# Patient Record
Sex: Female | Born: 1935
Health system: Southern US, Community
[De-identification: ages and names within clinical notes are randomized; demographics above are authoritative.]

## PROBLEM LIST (undated history)

## (undated) DIAGNOSIS — E785 Hyperlipidemia, unspecified: Secondary | ICD-10-CM

## (undated) DIAGNOSIS — G309 Alzheimer's disease, unspecified: Secondary | ICD-10-CM

## (undated) DIAGNOSIS — M4800 Spinal stenosis, site unspecified: Secondary | ICD-10-CM

## (undated) DIAGNOSIS — I10 Essential (primary) hypertension: Secondary | ICD-10-CM

## (undated) DIAGNOSIS — F0280 Dementia in other diseases classified elsewhere without behavioral disturbance: Secondary | ICD-10-CM

## (undated) DIAGNOSIS — F32A Depression, unspecified: Secondary | ICD-10-CM

## (undated) DIAGNOSIS — M810 Age-related osteoporosis without current pathological fracture: Secondary | ICD-10-CM

## (undated) DIAGNOSIS — F329 Major depressive disorder, single episode, unspecified: Secondary | ICD-10-CM

---

## 2015-08-16 DIAGNOSIS — L820 Inflamed seborrheic keratosis: Secondary | ICD-10-CM | POA: Diagnosis not present

## 2015-08-16 DIAGNOSIS — L570 Actinic keratosis: Secondary | ICD-10-CM | POA: Diagnosis not present

## 2015-09-01 DIAGNOSIS — L600 Ingrowing nail: Secondary | ICD-10-CM | POA: Diagnosis not present

## 2015-09-01 DIAGNOSIS — S90932S Unspecified superficial injury of left great toe, sequela: Secondary | ICD-10-CM | POA: Diagnosis not present

## 2015-09-27 DIAGNOSIS — K5730 Diverticulosis of large intestine without perforation or abscess without bleeding: Secondary | ICD-10-CM | POA: Diagnosis not present

## 2015-09-27 DIAGNOSIS — K640 First degree hemorrhoids: Secondary | ICD-10-CM | POA: Diagnosis not present

## 2015-09-27 DIAGNOSIS — Z86010 Personal history of colonic polyps: Secondary | ICD-10-CM | POA: Diagnosis not present

## 2015-09-27 DIAGNOSIS — D122 Benign neoplasm of ascending colon: Secondary | ICD-10-CM | POA: Diagnosis not present

## 2015-10-13 DIAGNOSIS — N3946 Mixed incontinence: Secondary | ICD-10-CM | POA: Diagnosis not present

## 2015-11-02 DIAGNOSIS — R34 Anuria and oliguria: Secondary | ICD-10-CM | POA: Diagnosis not present

## 2015-11-02 DIAGNOSIS — K5909 Other constipation: Secondary | ICD-10-CM | POA: Diagnosis not present

## 2015-11-02 DIAGNOSIS — K5900 Constipation, unspecified: Secondary | ICD-10-CM | POA: Diagnosis not present

## 2015-11-13 DIAGNOSIS — M1711 Unilateral primary osteoarthritis, right knee: Secondary | ICD-10-CM | POA: Diagnosis not present

## 2015-11-29 DIAGNOSIS — H16223 Keratoconjunctivitis sicca, not specified as Sjogren's, bilateral: Secondary | ICD-10-CM | POA: Diagnosis not present

## 2015-11-29 DIAGNOSIS — H1711 Central corneal opacity, right eye: Secondary | ICD-10-CM | POA: Diagnosis not present

## 2015-11-29 DIAGNOSIS — H01001 Unspecified blepharitis right upper eyelid: Secondary | ICD-10-CM | POA: Diagnosis not present

## 2015-12-06 DIAGNOSIS — N3942 Incontinence without sensory awareness: Secondary | ICD-10-CM | POA: Diagnosis not present

## 2015-12-06 DIAGNOSIS — N3946 Mixed incontinence: Secondary | ICD-10-CM | POA: Diagnosis not present

## 2015-12-06 DIAGNOSIS — N3944 Nocturnal enuresis: Secondary | ICD-10-CM | POA: Diagnosis not present

## 2015-12-06 DIAGNOSIS — Z0000 Encounter for general adult medical examination without abnormal findings: Secondary | ICD-10-CM | POA: Diagnosis not present

## 2015-12-22 DIAGNOSIS — Z0000 Encounter for general adult medical examination without abnormal findings: Secondary | ICD-10-CM | POA: Diagnosis not present

## 2015-12-22 DIAGNOSIS — N3020 Other chronic cystitis without hematuria: Secondary | ICD-10-CM | POA: Diagnosis not present

## 2015-12-22 DIAGNOSIS — N3946 Mixed incontinence: Secondary | ICD-10-CM | POA: Diagnosis not present

## 2016-01-12 DIAGNOSIS — N3941 Urge incontinence: Secondary | ICD-10-CM | POA: Diagnosis not present

## 2016-01-30 DIAGNOSIS — I10 Essential (primary) hypertension: Secondary | ICD-10-CM | POA: Diagnosis not present

## 2016-01-30 DIAGNOSIS — R011 Cardiac murmur, unspecified: Secondary | ICD-10-CM | POA: Diagnosis not present

## 2016-01-30 DIAGNOSIS — R002 Palpitations: Secondary | ICD-10-CM | POA: Diagnosis not present

## 2016-04-17 DIAGNOSIS — L570 Actinic keratosis: Secondary | ICD-10-CM | POA: Diagnosis not present

## 2016-04-17 DIAGNOSIS — Z85828 Personal history of other malignant neoplasm of skin: Secondary | ICD-10-CM | POA: Diagnosis not present

## 2016-04-17 DIAGNOSIS — Z08 Encounter for follow-up examination after completed treatment for malignant neoplasm: Secondary | ICD-10-CM | POA: Diagnosis not present

## 2016-05-27 DIAGNOSIS — H524 Presbyopia: Secondary | ICD-10-CM | POA: Diagnosis not present

## 2016-05-27 DIAGNOSIS — H5203 Hypermetropia, bilateral: Secondary | ICD-10-CM | POA: Diagnosis not present

## 2016-05-27 DIAGNOSIS — H52203 Unspecified astigmatism, bilateral: Secondary | ICD-10-CM | POA: Diagnosis not present

## 2016-05-27 DIAGNOSIS — H16223 Keratoconjunctivitis sicca, not specified as Sjogren's, bilateral: Secondary | ICD-10-CM | POA: Diagnosis not present

## 2016-07-08 DIAGNOSIS — L853 Xerosis cutis: Secondary | ICD-10-CM | POA: Diagnosis not present

## 2016-09-18 DIAGNOSIS — L570 Actinic keratosis: Secondary | ICD-10-CM | POA: Diagnosis not present

## 2016-09-18 DIAGNOSIS — L814 Other melanin hyperpigmentation: Secondary | ICD-10-CM | POA: Diagnosis not present

## 2016-10-16 DIAGNOSIS — F5104 Psychophysiologic insomnia: Secondary | ICD-10-CM | POA: Diagnosis not present

## 2016-10-16 DIAGNOSIS — R011 Cardiac murmur, unspecified: Secondary | ICD-10-CM | POA: Diagnosis not present

## 2016-10-16 DIAGNOSIS — R002 Palpitations: Secondary | ICD-10-CM | POA: Diagnosis not present

## 2016-10-16 DIAGNOSIS — R42 Dizziness and giddiness: Secondary | ICD-10-CM | POA: Diagnosis not present

## 2016-10-16 DIAGNOSIS — I10 Essential (primary) hypertension: Secondary | ICD-10-CM | POA: Diagnosis not present

## 2016-10-25 DIAGNOSIS — R002 Palpitations: Secondary | ICD-10-CM | POA: Diagnosis not present

## 2016-11-07 DIAGNOSIS — R002 Palpitations: Secondary | ICD-10-CM | POA: Diagnosis not present

## 2016-12-02 DIAGNOSIS — R42 Dizziness and giddiness: Secondary | ICD-10-CM | POA: Diagnosis not present

## 2016-12-02 DIAGNOSIS — R413 Other amnesia: Secondary | ICD-10-CM | POA: Diagnosis not present

## 2016-12-02 DIAGNOSIS — D649 Anemia, unspecified: Secondary | ICD-10-CM | POA: Diagnosis not present

## 2016-12-02 DIAGNOSIS — I10 Essential (primary) hypertension: Secondary | ICD-10-CM | POA: Diagnosis not present

## 2016-12-17 DIAGNOSIS — R413 Other amnesia: Secondary | ICD-10-CM | POA: Diagnosis not present

## 2016-12-30 DIAGNOSIS — H26492 Other secondary cataract, left eye: Secondary | ICD-10-CM | POA: Diagnosis not present

## 2016-12-30 DIAGNOSIS — H1711 Central corneal opacity, right eye: Secondary | ICD-10-CM | POA: Diagnosis not present

## 2016-12-30 DIAGNOSIS — H5203 Hypermetropia, bilateral: Secondary | ICD-10-CM | POA: Diagnosis not present

## 2016-12-30 DIAGNOSIS — H01005 Unspecified blepharitis left lower eyelid: Secondary | ICD-10-CM | POA: Diagnosis not present

## 2016-12-30 DIAGNOSIS — H43393 Other vitreous opacities, bilateral: Secondary | ICD-10-CM | POA: Diagnosis not present

## 2016-12-30 DIAGNOSIS — H01004 Unspecified blepharitis left upper eyelid: Secondary | ICD-10-CM | POA: Diagnosis not present

## 2016-12-30 DIAGNOSIS — H16223 Keratoconjunctivitis sicca, not specified as Sjogren's, bilateral: Secondary | ICD-10-CM | POA: Diagnosis not present

## 2016-12-30 DIAGNOSIS — H524 Presbyopia: Secondary | ICD-10-CM | POA: Diagnosis not present

## 2016-12-30 DIAGNOSIS — H01001 Unspecified blepharitis right upper eyelid: Secondary | ICD-10-CM | POA: Diagnosis not present

## 2016-12-30 DIAGNOSIS — H01002 Unspecified blepharitis right lower eyelid: Secondary | ICD-10-CM | POA: Diagnosis not present

## 2016-12-30 DIAGNOSIS — D3131 Benign neoplasm of right choroid: Secondary | ICD-10-CM | POA: Diagnosis not present

## 2016-12-30 DIAGNOSIS — H52203 Unspecified astigmatism, bilateral: Secondary | ICD-10-CM | POA: Diagnosis not present

## 2017-01-14 DIAGNOSIS — F341 Dysthymic disorder: Secondary | ICD-10-CM | POA: Diagnosis not present

## 2017-01-14 DIAGNOSIS — F0390 Unspecified dementia without behavioral disturbance: Secondary | ICD-10-CM | POA: Diagnosis not present

## 2017-01-17 DIAGNOSIS — M25552 Pain in left hip: Secondary | ICD-10-CM | POA: Diagnosis not present

## 2017-01-17 DIAGNOSIS — M5442 Lumbago with sciatica, left side: Secondary | ICD-10-CM | POA: Diagnosis not present

## 2017-02-05 DIAGNOSIS — F3289 Other specified depressive episodes: Secondary | ICD-10-CM | POA: Diagnosis not present

## 2017-02-10 DIAGNOSIS — G301 Alzheimer's disease with late onset: Secondary | ICD-10-CM | POA: Diagnosis not present

## 2017-02-10 DIAGNOSIS — F322 Major depressive disorder, single episode, severe without psychotic features: Secondary | ICD-10-CM | POA: Diagnosis not present

## 2017-02-10 DIAGNOSIS — R413 Other amnesia: Secondary | ICD-10-CM | POA: Diagnosis not present

## 2017-02-10 DIAGNOSIS — F0280 Dementia in other diseases classified elsewhere without behavioral disturbance: Secondary | ICD-10-CM | POA: Diagnosis not present

## 2017-02-10 DIAGNOSIS — F5104 Psychophysiologic insomnia: Secondary | ICD-10-CM | POA: Diagnosis not present

## 2017-02-13 DIAGNOSIS — M545 Low back pain: Secondary | ICD-10-CM | POA: Diagnosis not present

## 2017-02-13 DIAGNOSIS — M5416 Radiculopathy, lumbar region: Secondary | ICD-10-CM | POA: Diagnosis not present

## 2017-02-13 DIAGNOSIS — M5136 Other intervertebral disc degeneration, lumbar region: Secondary | ICD-10-CM | POA: Diagnosis not present

## 2017-02-17 DIAGNOSIS — T438X5A Adverse effect of other psychotropic drugs, initial encounter: Secondary | ICD-10-CM | POA: Diagnosis not present

## 2017-02-17 DIAGNOSIS — R21 Rash and other nonspecific skin eruption: Secondary | ICD-10-CM | POA: Diagnosis not present

## 2017-02-17 DIAGNOSIS — T380X5A Adverse effect of glucocorticoids and synthetic analogues, initial encounter: Secondary | ICD-10-CM | POA: Diagnosis not present

## 2017-02-19 DIAGNOSIS — M5136 Other intervertebral disc degeneration, lumbar region: Secondary | ICD-10-CM | POA: Diagnosis not present

## 2017-02-19 DIAGNOSIS — M5416 Radiculopathy, lumbar region: Secondary | ICD-10-CM | POA: Diagnosis not present

## 2017-02-20 DIAGNOSIS — J309 Allergic rhinitis, unspecified: Secondary | ICD-10-CM | POA: Diagnosis not present

## 2017-03-10 DIAGNOSIS — S3210XA Unspecified fracture of sacrum, initial encounter for closed fracture: Secondary | ICD-10-CM | POA: Diagnosis not present

## 2017-03-19 DIAGNOSIS — R413 Other amnesia: Secondary | ICD-10-CM | POA: Diagnosis not present

## 2017-03-19 DIAGNOSIS — M84350A Stress fracture, pelvis, initial encounter for fracture: Secondary | ICD-10-CM | POA: Diagnosis not present

## 2017-03-19 DIAGNOSIS — M48061 Spinal stenosis, lumbar region without neurogenic claudication: Secondary | ICD-10-CM | POA: Diagnosis not present

## 2017-03-19 DIAGNOSIS — M8000XD Age-related osteoporosis with current pathological fracture, unspecified site, subsequent encounter for fracture with routine healing: Secondary | ICD-10-CM | POA: Diagnosis not present

## 2017-04-02 DIAGNOSIS — S3219XA Other fracture of sacrum, initial encounter for closed fracture: Secondary | ICD-10-CM | POA: Diagnosis not present

## 2017-04-17 DIAGNOSIS — M545 Low back pain: Secondary | ICD-10-CM | POA: Diagnosis not present

## 2017-04-17 DIAGNOSIS — F322 Major depressive disorder, single episode, severe without psychotic features: Secondary | ICD-10-CM | POA: Diagnosis not present

## 2017-04-17 DIAGNOSIS — Z23 Encounter for immunization: Secondary | ICD-10-CM | POA: Diagnosis not present

## 2017-04-17 DIAGNOSIS — G301 Alzheimer's disease with late onset: Secondary | ICD-10-CM | POA: Diagnosis not present

## 2017-04-17 DIAGNOSIS — I10 Essential (primary) hypertension: Secondary | ICD-10-CM | POA: Diagnosis not present

## 2017-04-29 DIAGNOSIS — M4698 Unspecified inflammatory spondylopathy, sacral and sacrococcygeal region: Secondary | ICD-10-CM | POA: Diagnosis not present

## 2017-04-29 DIAGNOSIS — M84350A Stress fracture, pelvis, initial encounter for fracture: Secondary | ICD-10-CM | POA: Diagnosis not present

## 2017-04-29 DIAGNOSIS — M5136 Other intervertebral disc degeneration, lumbar region: Secondary | ICD-10-CM | POA: Diagnosis not present

## 2017-05-16 DIAGNOSIS — M48061 Spinal stenosis, lumbar region without neurogenic claudication: Secondary | ICD-10-CM | POA: Diagnosis not present

## 2017-05-16 DIAGNOSIS — M5136 Other intervertebral disc degeneration, lumbar region: Secondary | ICD-10-CM | POA: Diagnosis not present

## 2017-05-16 DIAGNOSIS — M84350A Stress fracture, pelvis, initial encounter for fracture: Secondary | ICD-10-CM | POA: Diagnosis not present

## 2017-05-16 DIAGNOSIS — M4698 Unspecified inflammatory spondylopathy, sacral and sacrococcygeal region: Secondary | ICD-10-CM | POA: Diagnosis not present

## 2017-05-21 DIAGNOSIS — M84350A Stress fracture, pelvis, initial encounter for fracture: Secondary | ICD-10-CM | POA: Diagnosis not present

## 2017-05-21 DIAGNOSIS — Z111 Encounter for screening for respiratory tuberculosis: Secondary | ICD-10-CM | POA: Diagnosis not present

## 2017-05-21 DIAGNOSIS — M4698 Unspecified inflammatory spondylopathy, sacral and sacrococcygeal region: Secondary | ICD-10-CM | POA: Diagnosis not present

## 2017-05-21 DIAGNOSIS — M48061 Spinal stenosis, lumbar region without neurogenic claudication: Secondary | ICD-10-CM | POA: Diagnosis not present

## 2017-05-21 DIAGNOSIS — I10 Essential (primary) hypertension: Secondary | ICD-10-CM | POA: Diagnosis not present

## 2017-05-21 DIAGNOSIS — D649 Anemia, unspecified: Secondary | ICD-10-CM | POA: Diagnosis not present

## 2017-05-21 DIAGNOSIS — M5136 Other intervertebral disc degeneration, lumbar region: Secondary | ICD-10-CM | POA: Diagnosis not present

## 2017-06-13 DIAGNOSIS — M4698 Unspecified inflammatory spondylopathy, sacral and sacrococcygeal region: Secondary | ICD-10-CM | POA: Diagnosis not present

## 2017-06-13 DIAGNOSIS — M5136 Other intervertebral disc degeneration, lumbar region: Secondary | ICD-10-CM | POA: Diagnosis not present

## 2017-06-13 DIAGNOSIS — M48061 Spinal stenosis, lumbar region without neurogenic claudication: Secondary | ICD-10-CM | POA: Diagnosis not present

## 2017-06-13 DIAGNOSIS — M545 Low back pain: Secondary | ICD-10-CM | POA: Diagnosis not present

## 2017-06-24 DIAGNOSIS — M4726 Other spondylosis with radiculopathy, lumbar region: Secondary | ICD-10-CM | POA: Diagnosis not present

## 2017-06-24 DIAGNOSIS — M5136 Other intervertebral disc degeneration, lumbar region: Secondary | ICD-10-CM | POA: Diagnosis not present

## 2017-06-24 DIAGNOSIS — M461 Sacroiliitis, not elsewhere classified: Secondary | ICD-10-CM | POA: Diagnosis not present

## 2017-06-24 DIAGNOSIS — M48061 Spinal stenosis, lumbar region without neurogenic claudication: Secondary | ICD-10-CM | POA: Diagnosis not present

## 2017-07-25 DIAGNOSIS — M48061 Spinal stenosis, lumbar region without neurogenic claudication: Secondary | ICD-10-CM | POA: Diagnosis not present

## 2017-07-25 DIAGNOSIS — M5136 Other intervertebral disc degeneration, lumbar region: Secondary | ICD-10-CM | POA: Diagnosis not present

## 2017-07-25 DIAGNOSIS — M25551 Pain in right hip: Secondary | ICD-10-CM | POA: Diagnosis not present

## 2017-09-02 DIAGNOSIS — I10 Essential (primary) hypertension: Secondary | ICD-10-CM | POA: Diagnosis not present

## 2017-09-02 DIAGNOSIS — M8000XD Age-related osteoporosis with current pathological fracture, unspecified site, subsequent encounter for fracture with routine healing: Secondary | ICD-10-CM | POA: Diagnosis not present

## 2017-09-02 DIAGNOSIS — R634 Abnormal weight loss: Secondary | ICD-10-CM | POA: Diagnosis not present

## 2017-09-02 DIAGNOSIS — G301 Alzheimer's disease with late onset: Secondary | ICD-10-CM | POA: Diagnosis not present

## 2017-09-02 DIAGNOSIS — M461 Sacroiliitis, not elsewhere classified: Secondary | ICD-10-CM | POA: Diagnosis not present

## 2017-09-02 DIAGNOSIS — M84350A Stress fracture, pelvis, initial encounter for fracture: Secondary | ICD-10-CM | POA: Diagnosis not present

## 2017-09-02 DIAGNOSIS — Z79899 Other long term (current) drug therapy: Secondary | ICD-10-CM | POA: Diagnosis not present

## 2017-09-02 DIAGNOSIS — M4698 Unspecified inflammatory spondylopathy, sacral and sacrococcygeal region: Secondary | ICD-10-CM | POA: Diagnosis not present

## 2017-09-03 DIAGNOSIS — R634 Abnormal weight loss: Secondary | ICD-10-CM | POA: Diagnosis not present

## 2017-09-10 DIAGNOSIS — R944 Abnormal results of kidney function studies: Secondary | ICD-10-CM | POA: Diagnosis not present

## 2017-09-10 DIAGNOSIS — R634 Abnormal weight loss: Secondary | ICD-10-CM | POA: Diagnosis not present

## 2017-09-17 DIAGNOSIS — M5126 Other intervertebral disc displacement, lumbar region: Secondary | ICD-10-CM | POA: Diagnosis not present

## 2017-09-17 DIAGNOSIS — M48061 Spinal stenosis, lumbar region without neurogenic claudication: Secondary | ICD-10-CM | POA: Diagnosis not present

## 2017-09-17 DIAGNOSIS — M810 Age-related osteoporosis without current pathological fracture: Secondary | ICD-10-CM | POA: Diagnosis not present

## 2017-09-17 DIAGNOSIS — F329 Major depressive disorder, single episode, unspecified: Secondary | ICD-10-CM | POA: Diagnosis not present

## 2017-09-17 DIAGNOSIS — Z9181 History of falling: Secondary | ICD-10-CM | POA: Diagnosis not present

## 2017-09-17 DIAGNOSIS — G301 Alzheimer's disease with late onset: Secondary | ICD-10-CM | POA: Diagnosis not present

## 2017-09-17 DIAGNOSIS — F0280 Dementia in other diseases classified elsewhere without behavioral disturbance: Secondary | ICD-10-CM | POA: Diagnosis not present

## 2017-09-17 DIAGNOSIS — I10 Essential (primary) hypertension: Secondary | ICD-10-CM | POA: Diagnosis not present

## 2017-10-02 DIAGNOSIS — M5136 Other intervertebral disc degeneration, lumbar region: Secondary | ICD-10-CM | POA: Diagnosis not present

## 2017-10-02 DIAGNOSIS — M461 Sacroiliitis, not elsewhere classified: Secondary | ICD-10-CM | POA: Diagnosis not present

## 2017-10-02 DIAGNOSIS — M48061 Spinal stenosis, lumbar region without neurogenic claudication: Secondary | ICD-10-CM | POA: Diagnosis not present

## 2017-10-02 DIAGNOSIS — M4698 Unspecified inflammatory spondylopathy, sacral and sacrococcygeal region: Secondary | ICD-10-CM | POA: Diagnosis not present

## 2017-10-13 DIAGNOSIS — F0280 Dementia in other diseases classified elsewhere without behavioral disturbance: Secondary | ICD-10-CM | POA: Diagnosis not present

## 2017-10-13 DIAGNOSIS — Z9181 History of falling: Secondary | ICD-10-CM | POA: Diagnosis not present

## 2017-10-13 DIAGNOSIS — G301 Alzheimer's disease with late onset: Secondary | ICD-10-CM | POA: Diagnosis not present

## 2017-10-13 DIAGNOSIS — M48061 Spinal stenosis, lumbar region without neurogenic claudication: Secondary | ICD-10-CM | POA: Diagnosis not present

## 2017-10-13 DIAGNOSIS — M5126 Other intervertebral disc displacement, lumbar region: Secondary | ICD-10-CM | POA: Diagnosis not present

## 2017-10-13 DIAGNOSIS — M810 Age-related osteoporosis without current pathological fracture: Secondary | ICD-10-CM | POA: Diagnosis not present

## 2017-10-13 DIAGNOSIS — F329 Major depressive disorder, single episode, unspecified: Secondary | ICD-10-CM | POA: Diagnosis not present

## 2017-10-13 DIAGNOSIS — I10 Essential (primary) hypertension: Secondary | ICD-10-CM | POA: Diagnosis not present

## 2017-11-01 ENCOUNTER — Other Ambulatory Visit: Payer: Self-pay

## 2017-11-01 ENCOUNTER — Emergency Department (HOSPITAL_BASED_OUTPATIENT_CLINIC_OR_DEPARTMENT_OTHER): Payer: PPO

## 2017-11-01 ENCOUNTER — Emergency Department (HOSPITAL_BASED_OUTPATIENT_CLINIC_OR_DEPARTMENT_OTHER)
Admission: EM | Admit: 2017-11-01 | Discharge: 2017-11-01 | Disposition: A | Discharge status: Home / Self Care | Payer: PPO | Attending: Emergency Medicine | Admitting: Emergency Medicine

## 2017-11-01 ENCOUNTER — Encounter (HOSPITAL_BASED_OUTPATIENT_CLINIC_OR_DEPARTMENT_OTHER): Payer: Self-pay | Admitting: Emergency Medicine

## 2017-11-01 DIAGNOSIS — R404 Transient alteration of awareness: Secondary | ICD-10-CM | POA: Diagnosis not present

## 2017-11-01 DIAGNOSIS — R42 Dizziness and giddiness: Secondary | ICD-10-CM | POA: Diagnosis not present

## 2017-11-01 DIAGNOSIS — G309 Alzheimer's disease, unspecified: Secondary | ICD-10-CM | POA: Diagnosis not present

## 2017-11-01 DIAGNOSIS — N3000 Acute cystitis without hematuria: Secondary | ICD-10-CM | POA: Diagnosis not present

## 2017-11-01 DIAGNOSIS — R011 Cardiac murmur, unspecified: Secondary | ICD-10-CM | POA: Diagnosis not present

## 2017-11-01 DIAGNOSIS — Z79899 Other long term (current) drug therapy: Secondary | ICD-10-CM | POA: Diagnosis not present

## 2017-11-01 DIAGNOSIS — I10 Essential (primary) hypertension: Secondary | ICD-10-CM | POA: Diagnosis not present

## 2017-11-01 HISTORY — DX: Essential (primary) hypertension: I10

## 2017-11-01 HISTORY — DX: Major depressive disorder, single episode, unspecified: F32.9

## 2017-11-01 HISTORY — DX: Alzheimer's disease, unspecified: G30.9

## 2017-11-01 HISTORY — DX: Depression, unspecified: F32.A

## 2017-11-01 HISTORY — DX: Dementia in other diseases classified elsewhere, unspecified severity, without behavioral disturbance, psychotic disturbance, mood disturbance, and anxiety: F02.80

## 2017-11-01 HISTORY — DX: Hyperlipidemia, unspecified: E78.5

## 2017-11-01 HISTORY — DX: Spinal stenosis, site unspecified: M48.00

## 2017-11-01 HISTORY — DX: Age-related osteoporosis without current pathological fracture: M81.0

## 2017-11-01 LAB — BASIC METABOLIC PANEL
ANION GAP: 9 (ref 5–15)
BUN: 16 mg/dL (ref 6–20)
CHLORIDE: 102 mmol/L (ref 101–111)
CO2: 25 mmol/L (ref 22–32)
Calcium: 9.1 mg/dL (ref 8.9–10.3)
Creatinine, Ser: 0.69 mg/dL (ref 0.44–1.00)
Glucose, Bld: 102 mg/dL — ABNORMAL HIGH (ref 65–99)
Potassium: 3.7 mmol/L (ref 3.5–5.1)
SODIUM: 136 mmol/L (ref 135–145)

## 2017-11-01 LAB — CBC WITH DIFFERENTIAL/PLATELET
BASOS ABS: 0 10*3/uL (ref 0.0–0.1)
Basophils Relative: 0 %
EOS PCT: 1 %
Eosinophils Absolute: 0.1 10*3/uL (ref 0.0–0.7)
HCT: 35.4 % — ABNORMAL LOW (ref 36.0–46.0)
HEMOGLOBIN: 11.3 g/dL — AB (ref 12.0–15.0)
LYMPHS PCT: 24 %
Lymphs Abs: 1.6 10*3/uL (ref 0.7–4.0)
MCH: 31.6 pg (ref 26.0–34.0)
MCHC: 31.9 g/dL (ref 30.0–36.0)
MCV: 98.9 fL (ref 78.0–100.0)
Monocytes Absolute: 0.6 10*3/uL (ref 0.1–1.0)
Monocytes Relative: 9 %
NEUTROS ABS: 4.4 10*3/uL (ref 1.7–7.7)
NEUTROS PCT: 66 %
PLATELETS: 183 10*3/uL (ref 150–400)
RBC: 3.58 MIL/uL — AB (ref 3.87–5.11)
RDW: 14.1 % (ref 11.5–15.5)
WBC: 6.7 10*3/uL (ref 4.0–10.5)

## 2017-11-01 LAB — URINALYSIS, ROUTINE W REFLEX MICROSCOPIC
BILIRUBIN URINE: NEGATIVE
GLUCOSE, UA: NEGATIVE mg/dL
Hgb urine dipstick: NEGATIVE
Ketones, ur: NEGATIVE mg/dL
Nitrite: POSITIVE — AB
PH: 6.5 (ref 5.0–8.0)
Protein, ur: NEGATIVE mg/dL
SPECIFIC GRAVITY, URINE: 1.01 (ref 1.005–1.030)

## 2017-11-01 LAB — URINALYSIS, MICROSCOPIC (REFLEX): RBC / HPF: NONE SEEN RBC/hpf (ref 0–5)

## 2017-11-01 LAB — PROTIME-INR
INR: 0.94
Prothrombin Time: 12.5 seconds (ref 11.4–15.2)

## 2017-11-01 MED ORDER — CEPHALEXIN 500 MG PO CAPS: 1000.0000 mg | ORAL_CAPSULE | Freq: Two times a day (BID) | ORAL | 0 refills | Status: AC

## 2017-11-01 MED ORDER — MECLIZINE HCL 25 MG PO TABS
25.0000 mg | ORAL_TABLET | Freq: Once | ORAL | Status: AC
Start: 2017-11-01 — End: 2017-11-01
  Administered 2017-11-01: 25 mg via ORAL
  Filled 2017-11-01: qty 1

## 2017-11-01 MED ORDER — MECLIZINE HCL 25 MG PO TABS: 25.0000 mg | ORAL_TABLET | Freq: Three times a day (TID) | ORAL | 0 refills | Status: AC | PRN

## 2017-11-01 NOTE — ED Provider Notes (Signed)
[facility] EMERGENCY DEPARTMENT Provider Note   CSN: [id number] Arrival date & time: 11/01/17  3295     History   Chief Complaint Chief Complaint  Patient presents with  . Dizziness    HPI Carolyn Schwartz is a 84 y.o. female.  HPI Yesterday afternoon patient reports he started to get dizzy episodes.  She started to feel unsteady and like she might fall.  Things were kind of moving around her.  No focal weakness numbness or tingling.  No headache.  Patient's daughter visited this morning and patient was having symptoms.  Patient's daughter reports that he had a seated position she was complaining of being dizzy.  There was no noted cognitive dysfunction or weakness or appearance of general illness. Past Medical History:  Diagnosis Date  . Alzheimer's dementia   . Depression   . Hyperlipidemia   . Hypertension   . Osteoporosis   . Spinal stenosis     There are no active problems to display for this patient.   The histories are not reviewed yet. Please review them in the "History" navigator section and refresh this [place].   OB History   None      Home Medications    Prior to Admission medications   Medication Sig Start Date End Date Taking? Authorizing Provider  calcium carbonate (TUMS - DOSED IN MG ELEMENTAL CALCIUM) 500 MG chewable tablet Chew 1 tablet by mouth daily.   Yes [provider]  donepezil (ARICEPT) 10 MG tablet Take 10 mg by mouth at bedtime.   Yes [provider]  famotidine (PEPCID) 20 MG tablet Take 20 mg by mouth 2 (two) times daily.   Yes [provider]  gabapentin (NEURONTIN) 100 MG capsule Take 100 mg by mouth 3 (three) times daily.   Yes [provider]  senna (SENOKOT) 8.6 MG tablet Take 1 tablet by mouth daily.   Yes [provider]  sertraline (ZOLOFT) 100 MG tablet Take 100 mg by mouth daily.   Yes [provider]  traMADol (ULTRAM) 50 MG tablet Take by mouth every 6 (six)  hours as needed.   Yes [provider]  cephALEXin (KEFLEX) 500 MG capsule Take 2 capsules (1,000 mg total) by mouth 2 (two) times daily. 11/01/17   Charlesetta Shanks, MD  meclizine (ANTIVERT) 25 MG tablet Take 1 tablet (25 mg total) by mouth 3 (three) times daily as needed for dizziness. 11/01/17   Charlesetta Shanks, MD    Family History No family history on file.  Social History Social History   Tobacco Use  . Smoking status: Not on file  Substance Use Topics  . Alcohol use: Not on file  . Drug use: Not on file     Allergies   Patient has no known allergies.   Review of Systems Review of Systems 10 Systems reviewed and are negative for acute change except as noted in the HPI.   Physical Exam Updated Vital Signs BP (!) 152/87 (BP Location: Right Arm)   Pulse 91   Temp 98.8 F (37.1 C)   Resp 20   Ht 5\' 4"  (1.626 m)   Wt 44.5 kg (98 lb)   SpO2 98%   BMI 16.82 kg/m   Physical Exam  Constitutional: She is oriented to person, place, and time. She appears well-developed and well-nourished. No distress.  HENT:  Head: Normocephalic and atraumatic.  Nose: Nose normal.  Mouth/Throat: Oropharynx is clear and moist.  Eyes: Pupils are equal, round, and  reactive to light. Conjunctivae and EOM are normal.  Neck: Neck supple.  Cardiovascular: Normal rate and regular rhythm.  Murmur heard. Pulmonary/Chest: Effort normal and breath sounds normal. No respiratory distress.  Abdominal: Soft. There is no tenderness.  Musculoskeletal: Normal range of motion. She exhibits no edema or tenderness.  Neurological: She is alert and oriented to person, place, and time. No cranial nerve deficit or sensory deficit. She exhibits normal muscle tone. Coordination normal.  Positive Dix-Hallpike maneuver to the left.  Skin: Skin is warm and dry.  Psychiatric: She has a normal mood and affect.  Nursing note and vitals reviewed.    ED Treatments / Results  Labs (all labs ordered are  listed, but only abnormal results are displayed) Labs Reviewed  URINALYSIS, ROUTINE W REFLEX MICROSCOPIC - Abnormal; Notable for the following components:      Result Value   Color, Urine STRAW (*)    Nitrite POSITIVE (*)    Leukocytes, UA TRACE (*)    All other components within normal limits  URINALYSIS, MICROSCOPIC (REFLEX) - Abnormal; Notable for the following components:   Bacteria, UA MANY (*)    Squamous Epithelial / LPF 0-5 (*)    All other components within normal limits  BASIC METABOLIC PANEL - Abnormal; Notable for the following components:   Glucose, Bld 102 (*)    All other components within normal limits  CBC WITH DIFFERENTIAL/PLATELET - Abnormal; Notable for the following components:   RBC 3.58 (*)    Hemoglobin 11.3 (*)    HCT 35.4 (*)    All other components within normal limits  URINE CULTURE  PROTIME-INR    EKG EKG Interpretation  Date/Time:  Saturday November 01 2017 11:17:12 EDT Ventricular Rate:  77 PR Interval:    QRS Duration: 97 QT Interval:  393 QTC Calculation: 445 R Axis:   -43 Text Interpretation:  Sinus rhythm LVH with secondary repolarization abnormality Anterior Q waves, possibly due to LVH Baseline wander in lead(s) I II aVR aVF agree. no old comparison Confirmed by Charlesetta Shanks [phone]) on 11/01/2017 11:49:16 AM   Radiology Ct Head Wo Contrast  Result Date: 11/01/2017 CLINICAL DATA:  Dizziness.  Unsteady gait. EXAM: CT HEAD WITHOUT CONTRAST TECHNIQUE: Contiguous axial images were obtained from the base of the skull through the vertex without intravenous contrast. COMPARISON:  None. FINDINGS: Brain: No subdural, epidural, or subarachnoid hemorrhage. Cerebellum, brainstem, and basal cisterns are normal. The ventricles and sulci are prominent, likely due to volume loss. Mild white matter changes are identified. No acute cortical ischemia or infarct is noted. No mass effect or midline shift. Vascular: No hyperdense vessel or unexpected  calcification. Skull: Normal. Negative for fracture or focal lesion. Sinuses/Orbits: No acute finding. Other: High attenuation adjacent to the left globe is likely postsurgical/previous instrumentation. Recommend clinical correlation. The extracranial soft tissues are otherwise normal. IMPRESSION: No acute intracranial abnormality to explain the patient's dizziness. Electronically Signed   By: Dorise Bullion III M.D   On: 11/01/2017 11:35    Procedures Procedures (including critical care time)  Medications Ordered in ED Medications  meclizine (ANTIVERT) tablet 25 mg (25 mg Oral Given 11/01/17 1141)     Initial Impression / Assessment and Plan / ED Course  I have reviewed the triage vital signs and the nursing notes.  Pertinent labs & imaging results that were available during my care of the patient were reviewed by me and considered in my medical decision making (see chart for details).  Final Clinical Impressions(s) / ED Diagnoses   Final diagnoses:  Vertigo  Acute cystitis without hematuria  Patient presents with dizziness that has vertiginous quality.  Normal neurologic examination, no headache and no cognitive changes.  Patient had positive Dix-Hallpike Pike maneuver for left side.  CT does not show any acute findings.  Patient has been up and ambulatory with steady baseline gait with walker several times in the emergency department.  This time feel she is stable to return to her assisted living with close supervision and trial of meclizine.  Patient's urine does test positive.  Will proceed with culture and empiric treatment with Keflex.  ED Discharge Orders        Ordered    meclizine (ANTIVERT) 25 MG tablet  3 times daily PRN     11/01/17 1327    cephALEXin (KEFLEX) 500 MG capsule  2 times daily     11/01/17 1330       Charlesetta Shanks, MD 11/01/17 1335

## 2017-11-01 NOTE — ED Triage Notes (Signed)
Per EMS, pt is from a skilled nursing facility. Woke up c/o dizziness with movement.

## 2017-11-01 NOTE — ED Notes (Signed)
Family at bedside. 

## 2017-11-03 LAB — URINE CULTURE

## 2017-11-04 ENCOUNTER — Telehealth: Payer: Self-pay | Admitting: Emergency Medicine

## 2017-11-04 NOTE — Telephone Encounter (Signed)
Post ED Visit - Positive Culture Follow-up  Culture report reviewed by antimicrobial stewardship pharmacist:  []  Elenor Quinones, Pharm.D. []  Heide Guile, Pharm.D., BCPS AQ-ID []  Parks Neptune, Pharm.D., BCPS []  Alycia Rossetti, Pharm.D., BCPS []  [place], Pharm.D., BCPS, AAHIVP [x]  Legrand Como, Pharm.D., BCPS, AAHIVP []  Salome Arnt, PharmD, BCPS []  Jalene Mullet, PharmD []  Vincenza Hews, PharmD, BCPS  Positive urine culture Treated with cephalexin, organism sensitive to the same and no further patient follow-up is required at this time.  Hazle Nordmann 11/04/2017, 3:35 PM

## 2017-11-10 DIAGNOSIS — F329 Major depressive disorder, single episode, unspecified: Secondary | ICD-10-CM | POA: Diagnosis not present

## 2017-11-10 DIAGNOSIS — F0280 Dementia in other diseases classified elsewhere without behavioral disturbance: Secondary | ICD-10-CM | POA: Diagnosis not present

## 2017-11-10 DIAGNOSIS — Z9181 History of falling: Secondary | ICD-10-CM | POA: Diagnosis not present

## 2017-11-10 DIAGNOSIS — G301 Alzheimer's disease with late onset: Secondary | ICD-10-CM | POA: Diagnosis not present

## 2017-11-10 DIAGNOSIS — M810 Age-related osteoporosis without current pathological fracture: Secondary | ICD-10-CM | POA: Diagnosis not present

## 2017-11-10 DIAGNOSIS — M5126 Other intervertebral disc displacement, lumbar region: Secondary | ICD-10-CM | POA: Diagnosis not present

## 2017-11-10 DIAGNOSIS — M48061 Spinal stenosis, lumbar region without neurogenic claudication: Secondary | ICD-10-CM | POA: Diagnosis not present

## 2017-11-10 DIAGNOSIS — I10 Essential (primary) hypertension: Secondary | ICD-10-CM | POA: Diagnosis not present

## 2017-11-26 DIAGNOSIS — M279 Disease of jaws, unspecified: Secondary | ICD-10-CM | POA: Diagnosis not present

## 2017-11-26 DIAGNOSIS — I6782 Cerebral ischemia: Secondary | ICD-10-CM | POA: Diagnosis not present

## 2017-11-26 DIAGNOSIS — R51 Headache: Secondary | ICD-10-CM | POA: Diagnosis not present

## 2017-11-26 DIAGNOSIS — S299XXA Unspecified injury of thorax, initial encounter: Secondary | ICD-10-CM | POA: Diagnosis not present

## 2017-11-26 DIAGNOSIS — S098XXA Other specified injuries of head, initial encounter: Secondary | ICD-10-CM | POA: Diagnosis not present

## 2017-11-26 DIAGNOSIS — Y999 Unspecified external cause status: Secondary | ICD-10-CM | POA: Diagnosis not present

## 2017-11-26 DIAGNOSIS — S0101XA Laceration without foreign body of scalp, initial encounter: Secondary | ICD-10-CM | POA: Diagnosis not present

## 2017-11-26 DIAGNOSIS — S199XXA Unspecified injury of neck, initial encounter: Secondary | ICD-10-CM | POA: Diagnosis not present

## 2017-11-26 DIAGNOSIS — I700 Atherosclerosis of aorta: Secondary | ICD-10-CM | POA: Diagnosis not present

## 2017-11-26 DIAGNOSIS — S20219A Contusion of unspecified front wall of thorax, initial encounter: Secondary | ICD-10-CM | POA: Diagnosis not present

## 2017-11-26 DIAGNOSIS — W01198A Fall on same level from slipping, tripping and stumbling with subsequent striking against other object, initial encounter: Secondary | ICD-10-CM | POA: Diagnosis not present

## 2017-11-26 DIAGNOSIS — S0990XA Unspecified injury of head, initial encounter: Secondary | ICD-10-CM | POA: Diagnosis not present

## 2017-11-26 DIAGNOSIS — G319 Degenerative disease of nervous system, unspecified: Secondary | ICD-10-CM | POA: Diagnosis not present

## 2017-12-08 DIAGNOSIS — R42 Dizziness and giddiness: Secondary | ICD-10-CM | POA: Diagnosis not present

## 2017-12-08 DIAGNOSIS — N3946 Mixed incontinence: Secondary | ICD-10-CM | POA: Diagnosis not present

## 2017-12-08 DIAGNOSIS — G301 Alzheimer's disease with late onset: Secondary | ICD-10-CM | POA: Diagnosis not present

## 2017-12-08 DIAGNOSIS — R634 Abnormal weight loss: Secondary | ICD-10-CM | POA: Diagnosis not present

## 2017-12-10 DIAGNOSIS — M5126 Other intervertebral disc displacement, lumbar region: Secondary | ICD-10-CM | POA: Diagnosis not present

## 2017-12-10 DIAGNOSIS — M48061 Spinal stenosis, lumbar region without neurogenic claudication: Secondary | ICD-10-CM | POA: Diagnosis not present

## 2017-12-10 DIAGNOSIS — I10 Essential (primary) hypertension: Secondary | ICD-10-CM | POA: Diagnosis not present

## 2017-12-10 DIAGNOSIS — Z9181 History of falling: Secondary | ICD-10-CM | POA: Diagnosis not present

## 2017-12-10 DIAGNOSIS — G301 Alzheimer's disease with late onset: Secondary | ICD-10-CM | POA: Diagnosis not present

## 2017-12-10 DIAGNOSIS — F0280 Dementia in other diseases classified elsewhere without behavioral disturbance: Secondary | ICD-10-CM | POA: Diagnosis not present

## 2017-12-10 DIAGNOSIS — M810 Age-related osteoporosis without current pathological fracture: Secondary | ICD-10-CM | POA: Diagnosis not present

## 2017-12-10 DIAGNOSIS — F329 Major depressive disorder, single episode, unspecified: Secondary | ICD-10-CM | POA: Diagnosis not present

## 2018-07-04 IMAGING — CT CT HEAD W/O CM
3 series · 15 of 46 positions shown, 18 images · non-contrast
Comparison: None.

CLINICAL DATA: Dizziness.  Unsteady gait.

EXAM:
CT HEAD WITHOUT CONTRAST
TECHNIQUE: Contiguous axial images were obtained from the base of the skull
through the vertex without intravenous contrast.

[Series 2: head wo · axial · 0.41mm/px · z∈[-154,-34]mm · 9 of 29 slices shown, 12 images]
[im 3/29  brain]
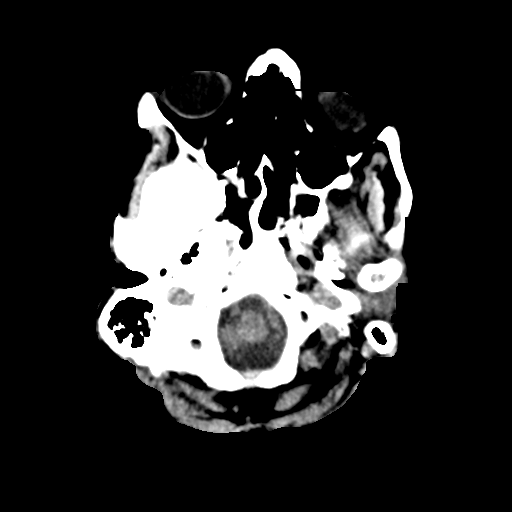
[im 3/29  bone]
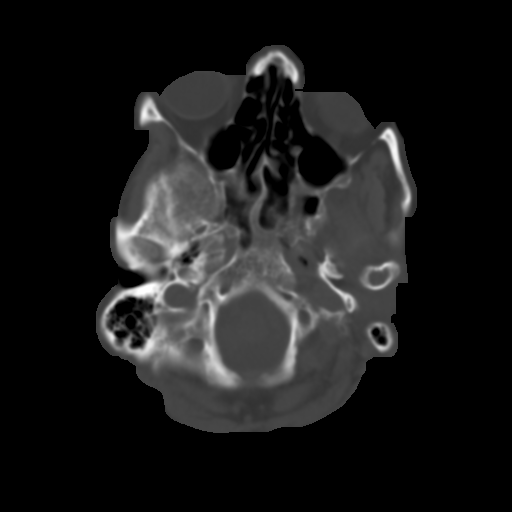
[im 6/29  brain]
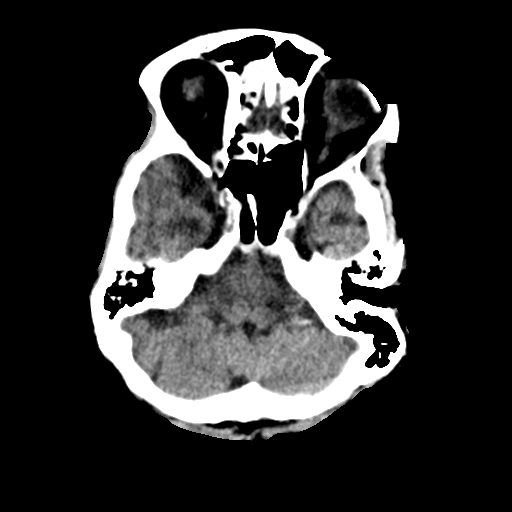
[im 9/29  brain]
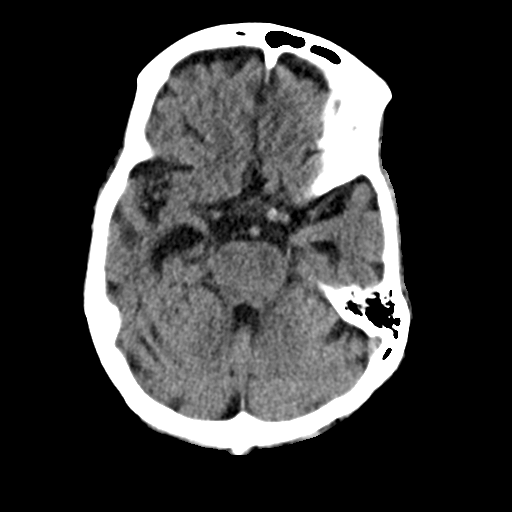
[im 12/29  brain]
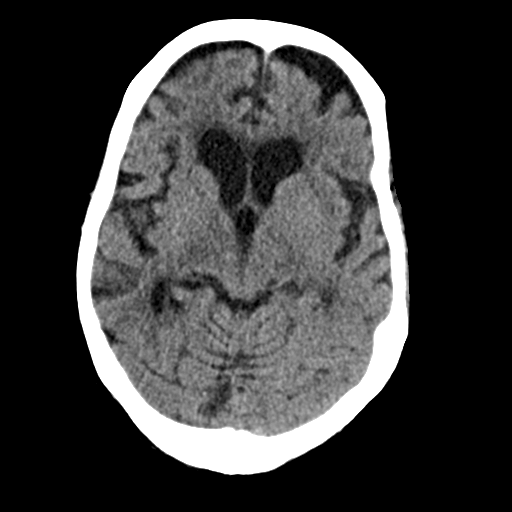
[im 15/29  brain]
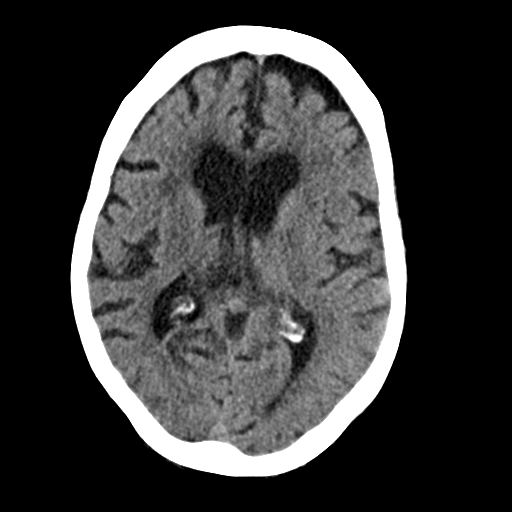
[im 15/29  bone]
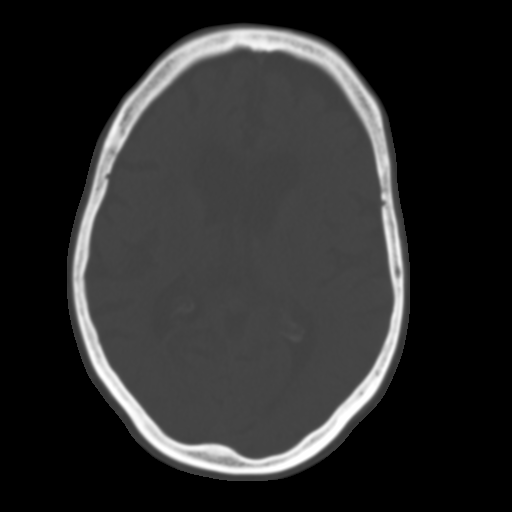
[im 18/29  brain]
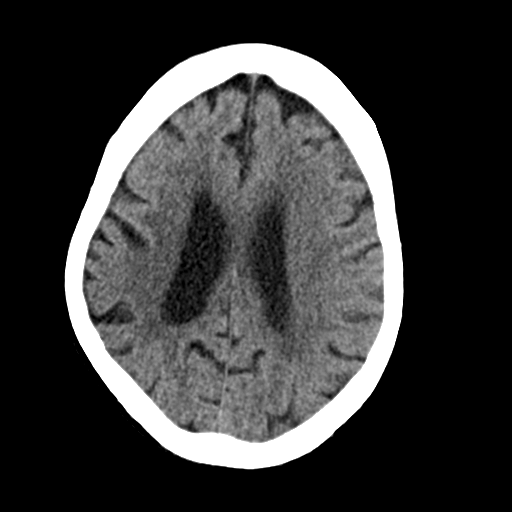
[im 21/29  brain]
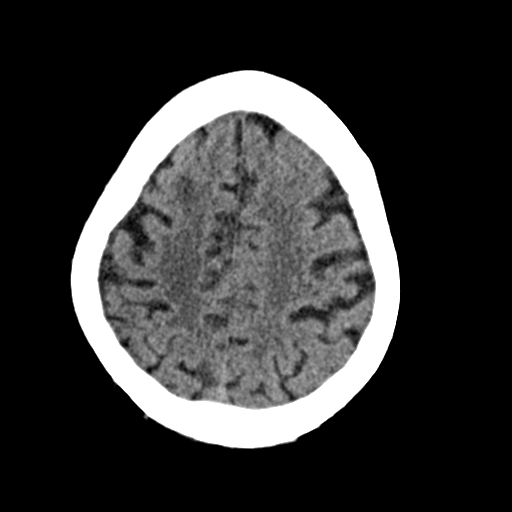
[im 24/29  brain]
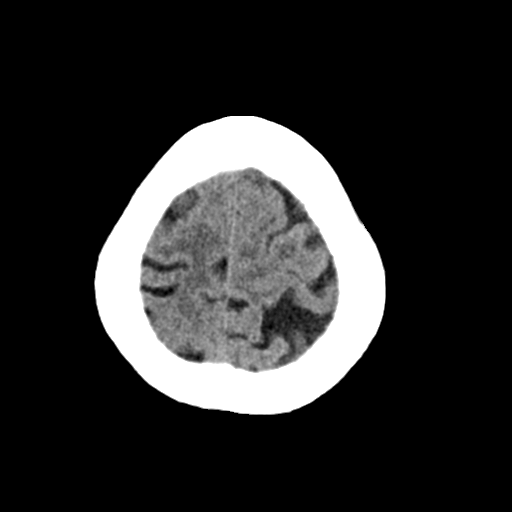
[im 27/29  brain]
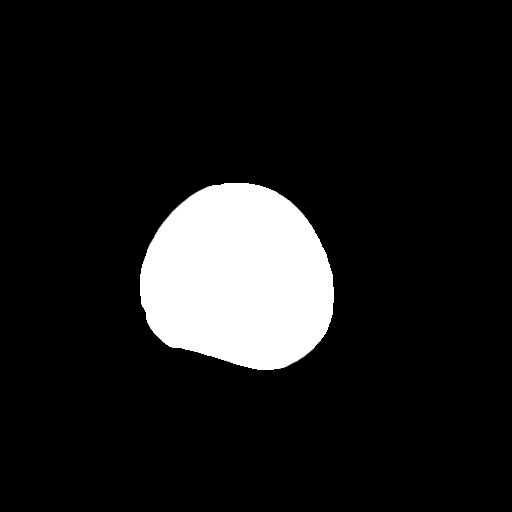
[im 27/29  bone]
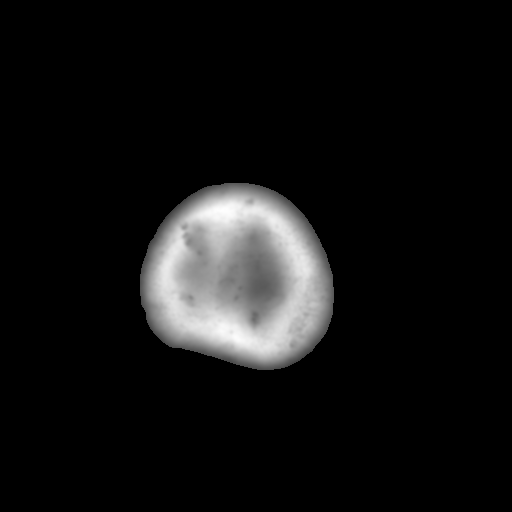

[Series 4: coronal soft · coronal · 0.31mm/px · 3 of 66 slices shown]
[im 22/66  brain]
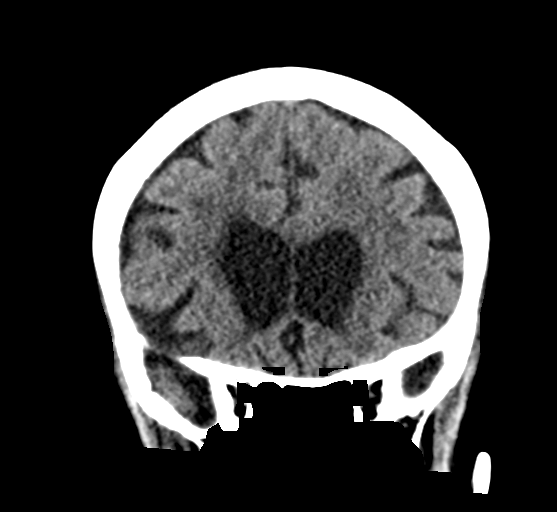
[im 29/66  brain]
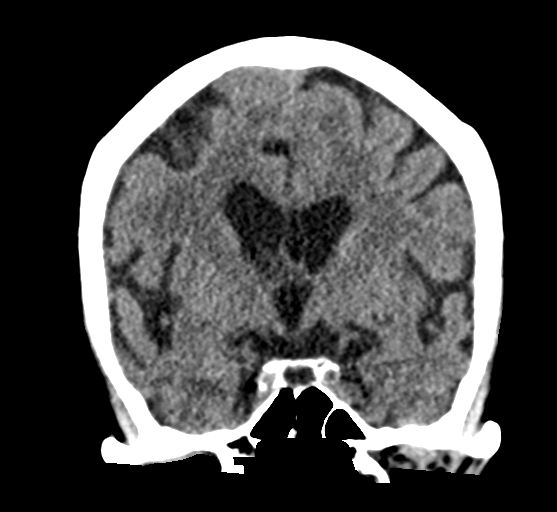
[im 37/66  brain]
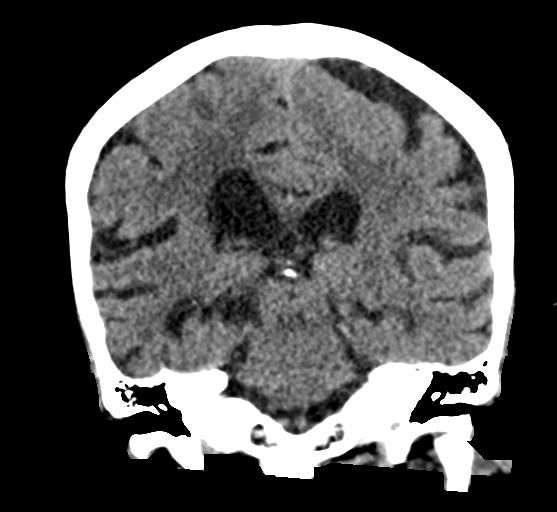

[Series 5: sag soft · sagittal · 0.31mm/px · 3 of 58 slices shown]
[im 20/58  brain]
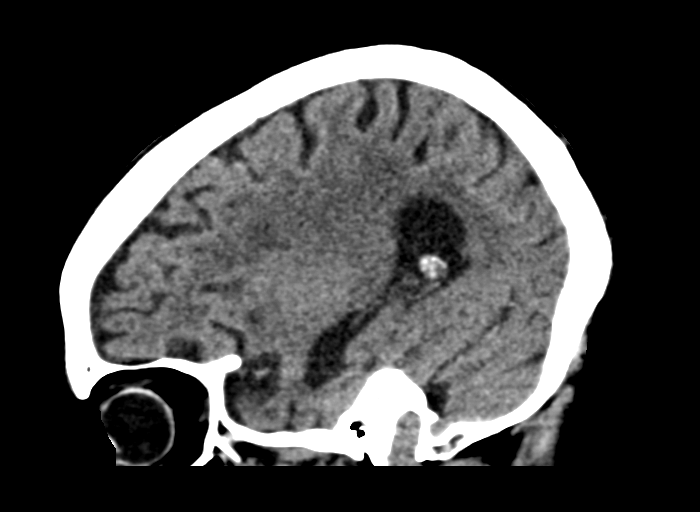
[im 29/58  brain]
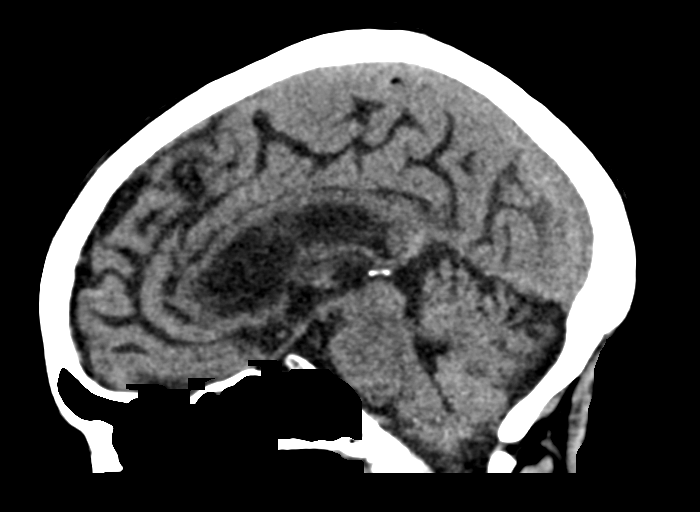
[im 39/58  brain]
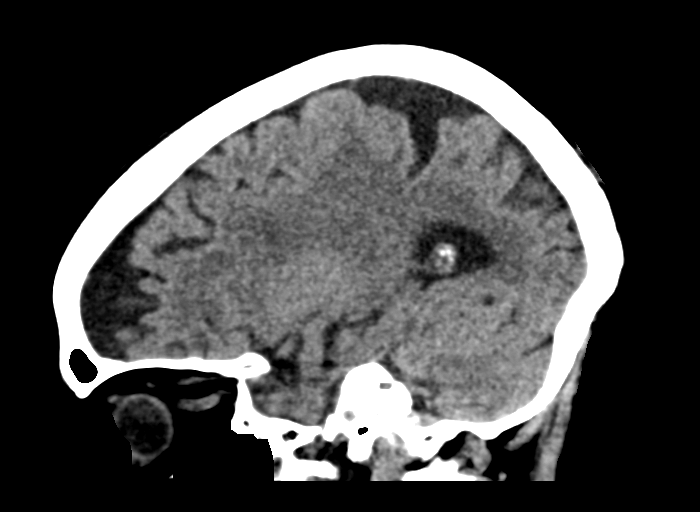

[15 of 46 positions shown; findings below may reference images not displayed]

FINDINGS: Brain: No subdural, epidural, or subarachnoid hemorrhage.
Cerebellum, brainstem, and basal cisterns are normal. The ventricles
and sulci are prominent, likely due to volume loss. Mild white
matter changes are identified. No acute cortical ischemia or infarct
is noted. No mass effect or midline shift.

Vascular: No hyperdense vessel or unexpected calcification.

Skull: Normal. Negative for fracture or focal lesion.

Sinuses/Orbits: No acute finding.

Other: High attenuation adjacent to the left globe is likely
postsurgical/previous instrumentation. Recommend clinical
correlation. The extracranial soft tissues are otherwise normal.
IMPRESSION: No acute intracranial abnormality to explain the patient's
dizziness.

## 2018-08-20 DIAGNOSIS — Z85828 Personal history of other malignant neoplasm of skin: Secondary | ICD-10-CM | POA: Diagnosis not present

## 2018-08-20 DIAGNOSIS — M48061 Spinal stenosis, lumbar region without neurogenic claudication: Secondary | ICD-10-CM | POA: Diagnosis not present

## 2018-08-20 DIAGNOSIS — Z681 Body mass index (BMI) 19 or less, adult: Secondary | ICD-10-CM | POA: Diagnosis not present

## 2018-08-20 DIAGNOSIS — R0902 Hypoxemia: Secondary | ICD-10-CM | POA: Diagnosis not present

## 2018-08-20 DIAGNOSIS — R488 Other symbolic dysfunctions: Secondary | ICD-10-CM | POA: Diagnosis not present

## 2018-08-20 DIAGNOSIS — L89150 Pressure ulcer of sacral region, unstageable: Secondary | ICD-10-CM | POA: Diagnosis not present

## 2018-08-20 DIAGNOSIS — M6281 Muscle weakness (generalized): Secondary | ICD-10-CM | POA: Diagnosis not present

## 2018-08-20 DIAGNOSIS — Z743 Need for continuous supervision: Secondary | ICD-10-CM | POA: Diagnosis not present

## 2018-08-20 DIAGNOSIS — S7291XA Unspecified fracture of right femur, initial encounter for closed fracture: Secondary | ICD-10-CM | POA: Diagnosis not present

## 2018-08-20 DIAGNOSIS — G309 Alzheimer's disease, unspecified: Secondary | ICD-10-CM | POA: Diagnosis not present

## 2018-08-20 DIAGNOSIS — E785 Hyperlipidemia, unspecified: Secondary | ICD-10-CM | POA: Diagnosis not present

## 2018-08-20 DIAGNOSIS — L89152 Pressure ulcer of sacral region, stage 2: Secondary | ICD-10-CM | POA: Diagnosis not present

## 2018-08-20 DIAGNOSIS — F0280 Dementia in other diseases classified elsewhere without behavioral disturbance: Secondary | ICD-10-CM | POA: Diagnosis not present

## 2018-08-20 DIAGNOSIS — Z79899 Other long term (current) drug therapy: Secondary | ICD-10-CM | POA: Diagnosis not present

## 2018-08-20 DIAGNOSIS — R410 Disorientation, unspecified: Secondary | ICD-10-CM | POA: Diagnosis not present

## 2018-08-20 DIAGNOSIS — Z792 Long term (current) use of antibiotics: Secondary | ICD-10-CM | POA: Diagnosis not present

## 2018-08-20 DIAGNOSIS — W19XXXA Unspecified fall, initial encounter: Secondary | ICD-10-CM | POA: Diagnosis not present

## 2018-08-20 DIAGNOSIS — R279 Unspecified lack of coordination: Secondary | ICD-10-CM | POA: Diagnosis not present

## 2018-08-20 DIAGNOSIS — R5381 Other malaise: Secondary | ICD-10-CM | POA: Diagnosis not present

## 2018-08-20 DIAGNOSIS — R011 Cardiac murmur, unspecified: Secondary | ICD-10-CM | POA: Diagnosis not present

## 2018-08-20 DIAGNOSIS — Z7409 Other reduced mobility: Secondary | ICD-10-CM | POA: Diagnosis not present

## 2018-08-20 DIAGNOSIS — S72001A Fracture of unspecified part of neck of right femur, initial encounter for closed fracture: Secondary | ICD-10-CM | POA: Diagnosis not present

## 2018-08-20 DIAGNOSIS — Z8262 Family history of osteoporosis: Secondary | ICD-10-CM | POA: Diagnosis not present

## 2018-08-20 DIAGNOSIS — M80051A Age-related osteoporosis with current pathological fracture, right femur, initial encounter for fracture: Secondary | ICD-10-CM | POA: Diagnosis not present

## 2018-08-20 DIAGNOSIS — R262 Difficulty in walking, not elsewhere classified: Secondary | ICD-10-CM | POA: Diagnosis not present

## 2018-08-20 DIAGNOSIS — S299XXA Unspecified injury of thorax, initial encounter: Secondary | ICD-10-CM | POA: Diagnosis not present

## 2018-08-20 DIAGNOSIS — S72101A Unspecified trochanteric fracture of right femur, initial encounter for closed fracture: Secondary | ICD-10-CM | POA: Diagnosis not present

## 2018-08-20 DIAGNOSIS — Z7982 Long term (current) use of aspirin: Secondary | ICD-10-CM | POA: Diagnosis not present

## 2018-08-20 DIAGNOSIS — E440 Moderate protein-calorie malnutrition: Secondary | ICD-10-CM | POA: Diagnosis not present

## 2018-08-20 DIAGNOSIS — W1839XA Other fall on same level, initial encounter: Secondary | ICD-10-CM | POA: Diagnosis not present

## 2018-08-20 DIAGNOSIS — I10 Essential (primary) hypertension: Secondary | ICD-10-CM | POA: Diagnosis not present

## 2018-08-20 DIAGNOSIS — F0390 Unspecified dementia without behavioral disturbance: Secondary | ICD-10-CM | POA: Diagnosis not present

## 2018-08-20 DIAGNOSIS — G4700 Insomnia, unspecified: Secondary | ICD-10-CM | POA: Diagnosis not present

## 2018-08-20 DIAGNOSIS — F419 Anxiety disorder, unspecified: Secondary | ICD-10-CM | POA: Diagnosis not present

## 2018-08-20 DIAGNOSIS — S72009A Fracture of unspecified part of neck of unspecified femur, initial encounter for closed fracture: Secondary | ICD-10-CM | POA: Diagnosis not present

## 2018-08-20 DIAGNOSIS — S72141D Displaced intertrochanteric fracture of right femur, subsequent encounter for closed fracture with routine healing: Secondary | ICD-10-CM | POA: Diagnosis not present

## 2018-08-20 DIAGNOSIS — Y998 Other external cause status: Secondary | ICD-10-CM | POA: Diagnosis not present

## 2018-08-20 DIAGNOSIS — I341 Nonrheumatic mitral (valve) prolapse: Secondary | ICD-10-CM | POA: Diagnosis not present

## 2018-08-20 DIAGNOSIS — M810 Age-related osteoporosis without current pathological fracture: Secondary | ICD-10-CM | POA: Diagnosis not present

## 2018-08-20 DIAGNOSIS — Z66 Do not resuscitate: Secondary | ICD-10-CM | POA: Diagnosis not present

## 2018-08-20 DIAGNOSIS — F329 Major depressive disorder, single episode, unspecified: Secondary | ICD-10-CM | POA: Diagnosis not present

## 2018-08-20 DIAGNOSIS — S72041A Displaced fracture of base of neck of right femur, initial encounter for closed fracture: Secondary | ICD-10-CM | POA: Diagnosis not present

## 2018-08-20 DIAGNOSIS — I493 Ventricular premature depolarization: Secondary | ICD-10-CM | POA: Diagnosis not present

## 2018-08-20 DIAGNOSIS — M84451D Pathological fracture, right femur, subsequent encounter for fracture with routine healing: Secondary | ICD-10-CM | POA: Diagnosis not present

## 2018-08-21 DIAGNOSIS — M80051A Age-related osteoporosis with current pathological fracture, right femur, initial encounter for fracture: Secondary | ICD-10-CM | POA: Diagnosis not present

## 2018-08-21 DIAGNOSIS — F0390 Unspecified dementia without behavioral disturbance: Secondary | ICD-10-CM | POA: Diagnosis not present

## 2018-08-21 DIAGNOSIS — L89152 Pressure ulcer of sacral region, stage 2: Secondary | ICD-10-CM | POA: Diagnosis not present

## 2018-08-21 DIAGNOSIS — I341 Nonrheumatic mitral (valve) prolapse: Secondary | ICD-10-CM | POA: Diagnosis not present

## 2018-08-21 DIAGNOSIS — E440 Moderate protein-calorie malnutrition: Secondary | ICD-10-CM | POA: Diagnosis not present

## 2018-08-21 DIAGNOSIS — S72041A Displaced fracture of base of neck of right femur, initial encounter for closed fracture: Secondary | ICD-10-CM | POA: Diagnosis not present

## 2018-08-22 DIAGNOSIS — M80051A Age-related osteoporosis with current pathological fracture, right femur, initial encounter for fracture: Secondary | ICD-10-CM | POA: Diagnosis not present

## 2018-08-22 DIAGNOSIS — E440 Moderate protein-calorie malnutrition: Secondary | ICD-10-CM | POA: Diagnosis not present

## 2018-08-22 DIAGNOSIS — L89152 Pressure ulcer of sacral region, stage 2: Secondary | ICD-10-CM | POA: Diagnosis not present

## 2018-08-22 DIAGNOSIS — S72041A Displaced fracture of base of neck of right femur, initial encounter for closed fracture: Secondary | ICD-10-CM | POA: Diagnosis not present

## 2018-08-22 DIAGNOSIS — I341 Nonrheumatic mitral (valve) prolapse: Secondary | ICD-10-CM | POA: Diagnosis not present

## 2018-08-22 DIAGNOSIS — F0390 Unspecified dementia without behavioral disturbance: Secondary | ICD-10-CM | POA: Diagnosis not present

## 2018-08-23 DIAGNOSIS — L89152 Pressure ulcer of sacral region, stage 2: Secondary | ICD-10-CM | POA: Diagnosis not present

## 2018-08-23 DIAGNOSIS — I341 Nonrheumatic mitral (valve) prolapse: Secondary | ICD-10-CM | POA: Diagnosis not present

## 2018-08-23 DIAGNOSIS — F0390 Unspecified dementia without behavioral disturbance: Secondary | ICD-10-CM | POA: Diagnosis not present

## 2018-08-23 DIAGNOSIS — S72041A Displaced fracture of base of neck of right femur, initial encounter for closed fracture: Secondary | ICD-10-CM | POA: Diagnosis not present

## 2018-08-23 DIAGNOSIS — E440 Moderate protein-calorie malnutrition: Secondary | ICD-10-CM | POA: Diagnosis not present

## 2018-08-23 DIAGNOSIS — M80051A Age-related osteoporosis with current pathological fracture, right femur, initial encounter for fracture: Secondary | ICD-10-CM | POA: Diagnosis not present

## 2018-08-24 DIAGNOSIS — S72041A Displaced fracture of base of neck of right femur, initial encounter for closed fracture: Secondary | ICD-10-CM | POA: Diagnosis not present

## 2018-08-24 DIAGNOSIS — I341 Nonrheumatic mitral (valve) prolapse: Secondary | ICD-10-CM | POA: Diagnosis not present

## 2018-08-24 DIAGNOSIS — M80051A Age-related osteoporosis with current pathological fracture, right femur, initial encounter for fracture: Secondary | ICD-10-CM | POA: Diagnosis not present

## 2018-08-24 DIAGNOSIS — L89152 Pressure ulcer of sacral region, stage 2: Secondary | ICD-10-CM | POA: Diagnosis not present

## 2018-08-24 DIAGNOSIS — F0390 Unspecified dementia without behavioral disturbance: Secondary | ICD-10-CM | POA: Diagnosis not present

## 2018-08-24 DIAGNOSIS — E440 Moderate protein-calorie malnutrition: Secondary | ICD-10-CM | POA: Diagnosis not present

## 2018-08-25 DIAGNOSIS — I341 Nonrheumatic mitral (valve) prolapse: Secondary | ICD-10-CM | POA: Diagnosis not present

## 2018-08-25 DIAGNOSIS — M80051A Age-related osteoporosis with current pathological fracture, right femur, initial encounter for fracture: Secondary | ICD-10-CM | POA: Diagnosis not present

## 2018-08-25 DIAGNOSIS — F0390 Unspecified dementia without behavioral disturbance: Secondary | ICD-10-CM | POA: Diagnosis not present

## 2018-08-25 DIAGNOSIS — E440 Moderate protein-calorie malnutrition: Secondary | ICD-10-CM | POA: Diagnosis not present

## 2018-08-25 DIAGNOSIS — S72041A Displaced fracture of base of neck of right femur, initial encounter for closed fracture: Secondary | ICD-10-CM | POA: Diagnosis not present

## 2018-08-25 DIAGNOSIS — L89152 Pressure ulcer of sacral region, stage 2: Secondary | ICD-10-CM | POA: Diagnosis not present

## 2018-08-26 DIAGNOSIS — E440 Moderate protein-calorie malnutrition: Secondary | ICD-10-CM | POA: Diagnosis not present

## 2018-08-26 DIAGNOSIS — M80051A Age-related osteoporosis with current pathological fracture, right femur, initial encounter for fracture: Secondary | ICD-10-CM | POA: Diagnosis not present

## 2018-08-26 DIAGNOSIS — L89152 Pressure ulcer of sacral region, stage 2: Secondary | ICD-10-CM | POA: Diagnosis not present

## 2018-08-26 DIAGNOSIS — F0390 Unspecified dementia without behavioral disturbance: Secondary | ICD-10-CM | POA: Diagnosis not present

## 2018-08-26 DIAGNOSIS — I341 Nonrheumatic mitral (valve) prolapse: Secondary | ICD-10-CM | POA: Diagnosis not present

## 2018-08-26 DIAGNOSIS — S72041A Displaced fracture of base of neck of right femur, initial encounter for closed fracture: Secondary | ICD-10-CM | POA: Diagnosis not present

## 2018-08-27 DIAGNOSIS — M80051A Age-related osteoporosis with current pathological fracture, right femur, initial encounter for fracture: Secondary | ICD-10-CM | POA: Diagnosis not present

## 2018-08-27 DIAGNOSIS — S72041A Displaced fracture of base of neck of right femur, initial encounter for closed fracture: Secondary | ICD-10-CM | POA: Diagnosis not present

## 2018-08-27 DIAGNOSIS — L89152 Pressure ulcer of sacral region, stage 2: Secondary | ICD-10-CM | POA: Diagnosis not present

## 2018-08-27 DIAGNOSIS — E785 Hyperlipidemia, unspecified: Secondary | ICD-10-CM | POA: Diagnosis not present

## 2018-08-27 DIAGNOSIS — G309 Alzheimer's disease, unspecified: Secondary | ICD-10-CM | POA: Diagnosis not present

## 2018-08-27 DIAGNOSIS — I10 Essential (primary) hypertension: Secondary | ICD-10-CM | POA: Diagnosis not present

## 2018-08-28 DIAGNOSIS — M80051A Age-related osteoporosis with current pathological fracture, right femur, initial encounter for fracture: Secondary | ICD-10-CM | POA: Diagnosis not present

## 2018-08-28 DIAGNOSIS — S72041A Displaced fracture of base of neck of right femur, initial encounter for closed fracture: Secondary | ICD-10-CM | POA: Diagnosis not present

## 2018-08-28 DIAGNOSIS — L89152 Pressure ulcer of sacral region, stage 2: Secondary | ICD-10-CM | POA: Diagnosis not present

## 2018-08-28 DIAGNOSIS — E785 Hyperlipidemia, unspecified: Secondary | ICD-10-CM | POA: Diagnosis not present

## 2018-08-28 DIAGNOSIS — G309 Alzheimer's disease, unspecified: Secondary | ICD-10-CM | POA: Diagnosis not present

## 2018-08-28 DIAGNOSIS — I10 Essential (primary) hypertension: Secondary | ICD-10-CM | POA: Diagnosis not present

## 2018-08-29 DIAGNOSIS — I10 Essential (primary) hypertension: Secondary | ICD-10-CM | POA: Diagnosis not present

## 2018-08-29 DIAGNOSIS — L89152 Pressure ulcer of sacral region, stage 2: Secondary | ICD-10-CM | POA: Diagnosis not present

## 2018-08-29 DIAGNOSIS — E785 Hyperlipidemia, unspecified: Secondary | ICD-10-CM | POA: Diagnosis not present

## 2018-08-29 DIAGNOSIS — G309 Alzheimer's disease, unspecified: Secondary | ICD-10-CM | POA: Diagnosis not present

## 2018-08-29 DIAGNOSIS — S72041A Displaced fracture of base of neck of right femur, initial encounter for closed fracture: Secondary | ICD-10-CM | POA: Diagnosis not present

## 2018-08-29 DIAGNOSIS — M80051A Age-related osteoporosis with current pathological fracture, right femur, initial encounter for fracture: Secondary | ICD-10-CM | POA: Diagnosis not present

## 2018-08-30 DIAGNOSIS — L89152 Pressure ulcer of sacral region, stage 2: Secondary | ICD-10-CM | POA: Diagnosis not present

## 2018-08-30 DIAGNOSIS — S72041A Displaced fracture of base of neck of right femur, initial encounter for closed fracture: Secondary | ICD-10-CM | POA: Diagnosis not present

## 2018-08-30 DIAGNOSIS — M80051A Age-related osteoporosis with current pathological fracture, right femur, initial encounter for fracture: Secondary | ICD-10-CM | POA: Diagnosis not present

## 2018-08-30 DIAGNOSIS — E785 Hyperlipidemia, unspecified: Secondary | ICD-10-CM | POA: Diagnosis not present

## 2018-08-30 DIAGNOSIS — I10 Essential (primary) hypertension: Secondary | ICD-10-CM | POA: Diagnosis not present

## 2018-08-30 DIAGNOSIS — G309 Alzheimer's disease, unspecified: Secondary | ICD-10-CM | POA: Diagnosis not present

## 2018-08-31 DIAGNOSIS — G309 Alzheimer's disease, unspecified: Secondary | ICD-10-CM | POA: Diagnosis not present

## 2018-08-31 DIAGNOSIS — F0280 Dementia in other diseases classified elsewhere without behavioral disturbance: Secondary | ICD-10-CM | POA: Diagnosis not present

## 2018-08-31 DIAGNOSIS — S7291XA Unspecified fracture of right femur, initial encounter for closed fracture: Secondary | ICD-10-CM | POA: Diagnosis not present

## 2018-09-01 DIAGNOSIS — G309 Alzheimer's disease, unspecified: Secondary | ICD-10-CM | POA: Diagnosis not present

## 2018-09-01 DIAGNOSIS — M80051A Age-related osteoporosis with current pathological fracture, right femur, initial encounter for fracture: Secondary | ICD-10-CM | POA: Diagnosis not present

## 2018-09-01 DIAGNOSIS — F0280 Dementia in other diseases classified elsewhere without behavioral disturbance: Secondary | ICD-10-CM | POA: Diagnosis not present

## 2018-09-02 DIAGNOSIS — M80051A Age-related osteoporosis with current pathological fracture, right femur, initial encounter for fracture: Secondary | ICD-10-CM | POA: Diagnosis not present

## 2018-09-02 DIAGNOSIS — R488 Other symbolic dysfunctions: Secondary | ICD-10-CM | POA: Diagnosis not present

## 2018-09-02 DIAGNOSIS — G309 Alzheimer's disease, unspecified: Secondary | ICD-10-CM | POA: Diagnosis not present

## 2018-09-02 DIAGNOSIS — F0280 Dementia in other diseases classified elsewhere without behavioral disturbance: Secondary | ICD-10-CM | POA: Diagnosis not present

## 2018-09-02 DIAGNOSIS — R2681 Unsteadiness on feet: Secondary | ICD-10-CM | POA: Diagnosis not present

## 2018-09-02 DIAGNOSIS — M84451D Pathological fracture, right femur, subsequent encounter for fracture with routine healing: Secondary | ICD-10-CM | POA: Diagnosis not present

## 2018-09-02 DIAGNOSIS — Z7409 Other reduced mobility: Secondary | ICD-10-CM | POA: Diagnosis not present

## 2018-09-02 DIAGNOSIS — L89150 Pressure ulcer of sacral region, unstageable: Secondary | ICD-10-CM | POA: Diagnosis not present

## 2018-09-02 DIAGNOSIS — M48061 Spinal stenosis, lumbar region without neurogenic claudication: Secondary | ICD-10-CM | POA: Diagnosis not present

## 2018-09-02 DIAGNOSIS — B351 Tinea unguium: Secondary | ICD-10-CM | POA: Diagnosis not present

## 2018-09-02 DIAGNOSIS — I739 Peripheral vascular disease, unspecified: Secondary | ICD-10-CM | POA: Diagnosis not present

## 2018-09-02 DIAGNOSIS — M6281 Muscle weakness (generalized): Secondary | ICD-10-CM | POA: Diagnosis not present

## 2018-09-02 DIAGNOSIS — M810 Age-related osteoporosis without current pathological fracture: Secondary | ICD-10-CM | POA: Diagnosis not present

## 2018-09-02 DIAGNOSIS — I10 Essential (primary) hypertension: Secondary | ICD-10-CM | POA: Diagnosis not present

## 2018-09-02 DIAGNOSIS — Z743 Need for continuous supervision: Secondary | ICD-10-CM | POA: Diagnosis not present

## 2018-09-02 DIAGNOSIS — F329 Major depressive disorder, single episode, unspecified: Secondary | ICD-10-CM | POA: Diagnosis not present

## 2018-09-02 DIAGNOSIS — R262 Difficulty in walking, not elsewhere classified: Secondary | ICD-10-CM | POA: Diagnosis not present

## 2018-09-02 DIAGNOSIS — E785 Hyperlipidemia, unspecified: Secondary | ICD-10-CM | POA: Diagnosis not present

## 2018-09-02 DIAGNOSIS — G4700 Insomnia, unspecified: Secondary | ICD-10-CM | POA: Diagnosis not present

## 2018-09-02 DIAGNOSIS — R279 Unspecified lack of coordination: Secondary | ICD-10-CM | POA: Diagnosis not present

## 2018-09-02 DIAGNOSIS — M8000XS Age-related osteoporosis with current pathological fracture, unspecified site, sequela: Secondary | ICD-10-CM | POA: Diagnosis not present

## 2018-09-02 DIAGNOSIS — S72001S Fracture of unspecified part of neck of right femur, sequela: Secondary | ICD-10-CM | POA: Diagnosis not present

## 2018-09-02 DIAGNOSIS — Z4789 Encounter for other orthopedic aftercare: Secondary | ICD-10-CM | POA: Diagnosis not present

## 2018-09-02 DIAGNOSIS — Z7982 Long term (current) use of aspirin: Secondary | ICD-10-CM | POA: Diagnosis not present

## 2018-09-02 DIAGNOSIS — Z85828 Personal history of other malignant neoplasm of skin: Secondary | ICD-10-CM | POA: Diagnosis not present

## 2018-09-04 DIAGNOSIS — S72001S Fracture of unspecified part of neck of right femur, sequela: Secondary | ICD-10-CM | POA: Diagnosis not present

## 2018-09-04 DIAGNOSIS — M8000XS Age-related osteoporosis with current pathological fracture, unspecified site, sequela: Secondary | ICD-10-CM | POA: Diagnosis not present

## 2018-09-04 DIAGNOSIS — Z7409 Other reduced mobility: Secondary | ICD-10-CM | POA: Diagnosis not present

## 2018-09-04 DIAGNOSIS — R2681 Unsteadiness on feet: Secondary | ICD-10-CM | POA: Diagnosis not present

## 2018-10-05 DIAGNOSIS — Z4789 Encounter for other orthopedic aftercare: Secondary | ICD-10-CM | POA: Diagnosis not present

## 2018-10-08 DIAGNOSIS — M6281 Muscle weakness (generalized): Secondary | ICD-10-CM | POA: Diagnosis not present

## 2018-10-08 DIAGNOSIS — R262 Difficulty in walking, not elsewhere classified: Secondary | ICD-10-CM | POA: Diagnosis not present

## 2018-10-08 DIAGNOSIS — R488 Other symbolic dysfunctions: Secondary | ICD-10-CM | POA: Diagnosis not present

## 2018-10-09 DIAGNOSIS — G301 Alzheimer's disease with late onset: Secondary | ICD-10-CM | POA: Diagnosis not present

## 2018-10-09 DIAGNOSIS — R2681 Unsteadiness on feet: Secondary | ICD-10-CM | POA: Diagnosis not present

## 2018-10-09 DIAGNOSIS — R410 Disorientation, unspecified: Secondary | ICD-10-CM | POA: Diagnosis not present

## 2018-10-09 DIAGNOSIS — F0280 Dementia in other diseases classified elsewhere without behavioral disturbance: Secondary | ICD-10-CM | POA: Diagnosis not present

## 2018-10-10 DIAGNOSIS — D649 Anemia, unspecified: Secondary | ICD-10-CM | POA: Diagnosis not present

## 2018-10-10 DIAGNOSIS — G309 Alzheimer's disease, unspecified: Secondary | ICD-10-CM | POA: Diagnosis not present

## 2018-10-12 DIAGNOSIS — G301 Alzheimer's disease with late onset: Secondary | ICD-10-CM | POA: Diagnosis not present

## 2018-10-12 DIAGNOSIS — E876 Hypokalemia: Secondary | ICD-10-CM | POA: Diagnosis not present

## 2018-10-12 DIAGNOSIS — M6281 Muscle weakness (generalized): Secondary | ICD-10-CM | POA: Diagnosis not present

## 2018-10-12 DIAGNOSIS — I10 Essential (primary) hypertension: Secondary | ICD-10-CM | POA: Diagnosis not present

## 2018-10-12 DIAGNOSIS — R488 Other symbolic dysfunctions: Secondary | ICD-10-CM | POA: Diagnosis not present

## 2018-10-12 DIAGNOSIS — F3340 Major depressive disorder, recurrent, in remission, unspecified: Secondary | ICD-10-CM | POA: Diagnosis not present

## 2018-10-13 DIAGNOSIS — Z7901 Long term (current) use of anticoagulants: Secondary | ICD-10-CM | POA: Diagnosis not present

## 2018-10-13 DIAGNOSIS — D649 Anemia, unspecified: Secondary | ICD-10-CM | POA: Diagnosis not present

## 2018-10-19 DIAGNOSIS — D649 Anemia, unspecified: Secondary | ICD-10-CM | POA: Diagnosis not present

## 2018-10-19 DIAGNOSIS — G309 Alzheimer's disease, unspecified: Secondary | ICD-10-CM | POA: Diagnosis not present

## 2018-10-19 DIAGNOSIS — M810 Age-related osteoporosis without current pathological fracture: Secondary | ICD-10-CM | POA: Diagnosis not present

## 2018-11-03 DIAGNOSIS — M8000XS Age-related osteoporosis with current pathological fracture, unspecified site, sequela: Secondary | ICD-10-CM | POA: Diagnosis not present

## 2018-11-03 DIAGNOSIS — G301 Alzheimer's disease with late onset: Secondary | ICD-10-CM | POA: Diagnosis not present

## 2018-11-03 DIAGNOSIS — S72001S Fracture of unspecified part of neck of right femur, sequela: Secondary | ICD-10-CM | POA: Diagnosis not present

## 2018-11-03 DIAGNOSIS — I10 Essential (primary) hypertension: Secondary | ICD-10-CM | POA: Diagnosis not present

## 2018-11-04 DIAGNOSIS — S72001S Fracture of unspecified part of neck of right femur, sequela: Secondary | ICD-10-CM | POA: Diagnosis not present

## 2018-11-04 DIAGNOSIS — F0280 Dementia in other diseases classified elsewhere without behavioral disturbance: Secondary | ICD-10-CM | POA: Diagnosis not present

## 2018-11-04 DIAGNOSIS — Z7409 Other reduced mobility: Secondary | ICD-10-CM | POA: Diagnosis not present

## 2018-11-04 DIAGNOSIS — R296 Repeated falls: Secondary | ICD-10-CM | POA: Diagnosis not present

## 2018-11-11 DIAGNOSIS — M6281 Muscle weakness (generalized): Secondary | ICD-10-CM | POA: Diagnosis not present

## 2018-11-11 DIAGNOSIS — R262 Difficulty in walking, not elsewhere classified: Secondary | ICD-10-CM | POA: Diagnosis not present

## 2018-12-09 DIAGNOSIS — M6281 Muscle weakness (generalized): Secondary | ICD-10-CM | POA: Diagnosis not present

## 2018-12-09 DIAGNOSIS — R296 Repeated falls: Secondary | ICD-10-CM | POA: Diagnosis not present

## 2018-12-09 DIAGNOSIS — R2681 Unsteadiness on feet: Secondary | ICD-10-CM | POA: Diagnosis not present

## 2018-12-11 DIAGNOSIS — R262 Difficulty in walking, not elsewhere classified: Secondary | ICD-10-CM | POA: Diagnosis not present

## 2018-12-11 DIAGNOSIS — M6281 Muscle weakness (generalized): Secondary | ICD-10-CM | POA: Diagnosis not present

## 2019-01-04 DIAGNOSIS — I10 Essential (primary) hypertension: Secondary | ICD-10-CM | POA: Diagnosis not present

## 2019-01-04 DIAGNOSIS — G301 Alzheimer's disease with late onset: Secondary | ICD-10-CM | POA: Diagnosis not present

## 2019-01-04 DIAGNOSIS — R2681 Unsteadiness on feet: Secondary | ICD-10-CM | POA: Diagnosis not present

## 2019-01-04 DIAGNOSIS — N3946 Mixed incontinence: Secondary | ICD-10-CM | POA: Diagnosis not present

## 2019-01-27 DIAGNOSIS — R293 Abnormal posture: Secondary | ICD-10-CM | POA: Diagnosis not present

## 2019-01-27 DIAGNOSIS — M6281 Muscle weakness (generalized): Secondary | ICD-10-CM | POA: Diagnosis not present

## 2019-02-01 DIAGNOSIS — W19XXXA Unspecified fall, initial encounter: Secondary | ICD-10-CM | POA: Diagnosis not present

## 2019-02-01 DIAGNOSIS — R2681 Unsteadiness on feet: Secondary | ICD-10-CM | POA: Diagnosis not present

## 2019-02-01 DIAGNOSIS — G301 Alzheimer's disease with late onset: Secondary | ICD-10-CM | POA: Diagnosis not present

## 2019-02-01 DIAGNOSIS — Z7409 Other reduced mobility: Secondary | ICD-10-CM | POA: Diagnosis not present

## 2019-02-15 DIAGNOSIS — R2681 Unsteadiness on feet: Secondary | ICD-10-CM | POA: Diagnosis not present

## 2019-02-15 DIAGNOSIS — G301 Alzheimer's disease with late onset: Secondary | ICD-10-CM | POA: Diagnosis not present

## 2019-02-15 DIAGNOSIS — F3340 Major depressive disorder, recurrent, in remission, unspecified: Secondary | ICD-10-CM | POA: Diagnosis not present

## 2019-02-15 DIAGNOSIS — I10 Essential (primary) hypertension: Secondary | ICD-10-CM | POA: Diagnosis not present

## 2019-03-31 DIAGNOSIS — S41119A Laceration without foreign body of unspecified upper arm, initial encounter: Secondary | ICD-10-CM | POA: Diagnosis not present

## 2019-03-31 DIAGNOSIS — F0280 Dementia in other diseases classified elsewhere without behavioral disturbance: Secondary | ICD-10-CM | POA: Diagnosis not present

## 2019-03-31 DIAGNOSIS — G301 Alzheimer's disease with late onset: Secondary | ICD-10-CM | POA: Diagnosis not present

## 2019-04-08 DIAGNOSIS — B9729 Other coronavirus as the cause of diseases classified elsewhere: Secondary | ICD-10-CM | POA: Diagnosis not present

## 2019-04-13 DIAGNOSIS — M6281 Muscle weakness (generalized): Secondary | ICD-10-CM | POA: Diagnosis not present

## 2019-04-20 DIAGNOSIS — U071 COVID-19: Secondary | ICD-10-CM | POA: Diagnosis not present

## 2019-04-27 DIAGNOSIS — Z03818 Encounter for observation for suspected exposure to other biological agents ruled out: Secondary | ICD-10-CM | POA: Diagnosis not present

## 2019-04-30 DIAGNOSIS — I10 Essential (primary) hypertension: Secondary | ICD-10-CM | POA: Diagnosis not present

## 2019-04-30 DIAGNOSIS — F0390 Unspecified dementia without behavioral disturbance: Secondary | ICD-10-CM | POA: Diagnosis not present

## 2019-04-30 DIAGNOSIS — R627 Adult failure to thrive: Secondary | ICD-10-CM | POA: Diagnosis not present

## 2019-04-30 DIAGNOSIS — R270 Ataxia, unspecified: Secondary | ICD-10-CM | POA: Diagnosis not present

## 2019-05-06 DIAGNOSIS — U071 COVID-19: Secondary | ICD-10-CM | POA: Diagnosis not present

## 2019-05-18 DIAGNOSIS — U071 COVID-19: Secondary | ICD-10-CM | POA: Diagnosis not present

## 2019-05-20 DIAGNOSIS — U071 COVID-19: Secondary | ICD-10-CM | POA: Diagnosis not present

## 2019-05-24 DIAGNOSIS — U071 COVID-19: Secondary | ICD-10-CM | POA: Diagnosis not present

## 2019-05-31 DIAGNOSIS — R2681 Unsteadiness on feet: Secondary | ICD-10-CM | POA: Diagnosis not present

## 2019-05-31 DIAGNOSIS — F0280 Dementia in other diseases classified elsewhere without behavioral disturbance: Secondary | ICD-10-CM | POA: Diagnosis not present

## 2019-05-31 DIAGNOSIS — U071 COVID-19: Secondary | ICD-10-CM | POA: Diagnosis not present

## 2019-05-31 DIAGNOSIS — R296 Repeated falls: Secondary | ICD-10-CM | POA: Diagnosis not present

## 2019-05-31 DIAGNOSIS — G301 Alzheimer's disease with late onset: Secondary | ICD-10-CM | POA: Diagnosis not present

## 2019-06-01 DIAGNOSIS — H43813 Vitreous degeneration, bilateral: Secondary | ICD-10-CM | POA: Diagnosis not present

## 2019-06-01 DIAGNOSIS — H04123 Dry eye syndrome of bilateral lacrimal glands: Secondary | ICD-10-CM | POA: Diagnosis not present

## 2019-06-01 DIAGNOSIS — H35013 Changes in retinal vascular appearance, bilateral: Secondary | ICD-10-CM | POA: Diagnosis not present

## 2019-06-01 DIAGNOSIS — H26492 Other secondary cataract, left eye: Secondary | ICD-10-CM | POA: Diagnosis not present

## 2019-06-03 DIAGNOSIS — R2681 Unsteadiness on feet: Secondary | ICD-10-CM | POA: Diagnosis not present

## 2019-06-03 DIAGNOSIS — G301 Alzheimer's disease with late onset: Secondary | ICD-10-CM | POA: Diagnosis not present

## 2019-06-03 DIAGNOSIS — I10 Essential (primary) hypertension: Secondary | ICD-10-CM | POA: Diagnosis not present

## 2019-06-03 DIAGNOSIS — F0280 Dementia in other diseases classified elsewhere without behavioral disturbance: Secondary | ICD-10-CM | POA: Diagnosis not present

## 2019-06-07 DIAGNOSIS — U071 COVID-19: Secondary | ICD-10-CM | POA: Diagnosis not present

## 2019-06-10 DIAGNOSIS — U071 COVID-19: Secondary | ICD-10-CM | POA: Diagnosis not present

## 2019-06-11 DIAGNOSIS — M6281 Muscle weakness (generalized): Secondary | ICD-10-CM | POA: Diagnosis not present

## 2019-06-11 DIAGNOSIS — R2689 Other abnormalities of gait and mobility: Secondary | ICD-10-CM | POA: Diagnosis not present

## 2019-06-14 DIAGNOSIS — U071 COVID-19: Secondary | ICD-10-CM | POA: Diagnosis not present

## 2019-06-16 DIAGNOSIS — R2689 Other abnormalities of gait and mobility: Secondary | ICD-10-CM | POA: Diagnosis not present

## 2019-06-16 DIAGNOSIS — M6281 Muscle weakness (generalized): Secondary | ICD-10-CM | POA: Diagnosis not present

## 2019-06-17 DIAGNOSIS — U071 COVID-19: Secondary | ICD-10-CM | POA: Diagnosis not present

## 2019-06-21 DIAGNOSIS — U071 COVID-19: Secondary | ICD-10-CM | POA: Diagnosis not present

## 2019-06-24 DIAGNOSIS — U071 COVID-19: Secondary | ICD-10-CM | POA: Diagnosis not present

## 2019-06-28 DIAGNOSIS — U071 COVID-19: Secondary | ICD-10-CM | POA: Diagnosis not present

## 2019-06-29 DIAGNOSIS — L84 Corns and callosities: Secondary | ICD-10-CM | POA: Diagnosis not present

## 2019-06-29 DIAGNOSIS — L603 Nail dystrophy: Secondary | ICD-10-CM | POA: Diagnosis not present

## 2019-06-29 DIAGNOSIS — I739 Peripheral vascular disease, unspecified: Secondary | ICD-10-CM | POA: Diagnosis not present

## 2019-06-30 DIAGNOSIS — Z9181 History of falling: Secondary | ICD-10-CM | POA: Diagnosis not present

## 2019-06-30 DIAGNOSIS — I10 Essential (primary) hypertension: Secondary | ICD-10-CM | POA: Diagnosis not present

## 2019-06-30 DIAGNOSIS — F0390 Unspecified dementia without behavioral disturbance: Secondary | ICD-10-CM | POA: Diagnosis not present

## 2019-06-30 DIAGNOSIS — R627 Adult failure to thrive: Secondary | ICD-10-CM | POA: Diagnosis not present

## 2019-07-05 DIAGNOSIS — U071 COVID-19: Secondary | ICD-10-CM | POA: Diagnosis not present

## 2019-07-12 DIAGNOSIS — U071 COVID-19: Secondary | ICD-10-CM | POA: Diagnosis not present

## 2019-07-13 DIAGNOSIS — M6281 Muscle weakness (generalized): Secondary | ICD-10-CM | POA: Diagnosis not present

## 2019-07-13 DIAGNOSIS — R2689 Other abnormalities of gait and mobility: Secondary | ICD-10-CM | POA: Diagnosis not present

## 2019-07-15 DIAGNOSIS — U071 COVID-19: Secondary | ICD-10-CM | POA: Diagnosis not present

## 2019-07-19 DIAGNOSIS — U071 COVID-19: Secondary | ICD-10-CM | POA: Diagnosis not present

## 2019-07-22 DIAGNOSIS — U071 COVID-19: Secondary | ICD-10-CM | POA: Diagnosis not present

## 2019-07-27 DIAGNOSIS — R627 Adult failure to thrive: Secondary | ICD-10-CM | POA: Diagnosis not present

## 2019-07-27 DIAGNOSIS — I10 Essential (primary) hypertension: Secondary | ICD-10-CM | POA: Diagnosis not present

## 2019-07-27 DIAGNOSIS — F0390 Unspecified dementia without behavioral disturbance: Secondary | ICD-10-CM | POA: Diagnosis not present

## 2019-07-27 DIAGNOSIS — K219 Gastro-esophageal reflux disease without esophagitis: Secondary | ICD-10-CM | POA: Diagnosis not present

## 2019-07-27 DIAGNOSIS — M1990 Unspecified osteoarthritis, unspecified site: Secondary | ICD-10-CM | POA: Diagnosis not present

## 2019-07-29 DIAGNOSIS — U071 COVID-19: Secondary | ICD-10-CM | POA: Diagnosis not present

## 2019-08-02 DIAGNOSIS — Z20828 Contact with and (suspected) exposure to other viral communicable diseases: Secondary | ICD-10-CM | POA: Diagnosis not present

## 2019-08-05 DIAGNOSIS — U071 COVID-19: Secondary | ICD-10-CM | POA: Diagnosis not present

## 2019-08-09 DIAGNOSIS — U071 COVID-19: Secondary | ICD-10-CM | POA: Diagnosis not present

## 2019-08-13 DIAGNOSIS — R2689 Other abnormalities of gait and mobility: Secondary | ICD-10-CM | POA: Diagnosis not present

## 2019-08-13 DIAGNOSIS — M6281 Muscle weakness (generalized): Secondary | ICD-10-CM | POA: Diagnosis not present

## 2019-08-16 DIAGNOSIS — U071 COVID-19: Secondary | ICD-10-CM | POA: Diagnosis not present

## 2019-08-23 DIAGNOSIS — U071 COVID-19: Secondary | ICD-10-CM | POA: Diagnosis not present

## 2019-08-25 DIAGNOSIS — K219 Gastro-esophageal reflux disease without esophagitis: Secondary | ICD-10-CM | POA: Diagnosis not present

## 2019-08-25 DIAGNOSIS — F0390 Unspecified dementia without behavioral disturbance: Secondary | ICD-10-CM | POA: Diagnosis not present

## 2019-08-25 DIAGNOSIS — I10 Essential (primary) hypertension: Secondary | ICD-10-CM | POA: Diagnosis not present

## 2019-08-25 DIAGNOSIS — U071 COVID-19: Secondary | ICD-10-CM | POA: Diagnosis not present

## 2019-08-26 DIAGNOSIS — U071 COVID-19: Secondary | ICD-10-CM | POA: Diagnosis not present

## 2019-08-26 DIAGNOSIS — F0390 Unspecified dementia without behavioral disturbance: Secondary | ICD-10-CM | POA: Diagnosis not present

## 2019-09-01 DIAGNOSIS — M1990 Unspecified osteoarthritis, unspecified site: Secondary | ICD-10-CM | POA: Diagnosis not present

## 2019-09-01 DIAGNOSIS — R627 Adult failure to thrive: Secondary | ICD-10-CM | POA: Diagnosis not present

## 2019-09-01 DIAGNOSIS — F329 Major depressive disorder, single episode, unspecified: Secondary | ICD-10-CM | POA: Diagnosis not present

## 2019-09-01 DIAGNOSIS — E559 Vitamin D deficiency, unspecified: Secondary | ICD-10-CM | POA: Diagnosis not present

## 2019-09-01 DIAGNOSIS — F0390 Unspecified dementia without behavioral disturbance: Secondary | ICD-10-CM | POA: Diagnosis not present

## 2019-09-01 DIAGNOSIS — K219 Gastro-esophageal reflux disease without esophagitis: Secondary | ICD-10-CM | POA: Diagnosis not present

## 2019-09-01 DIAGNOSIS — F5101 Primary insomnia: Secondary | ICD-10-CM | POA: Diagnosis not present

## 2019-09-06 DIAGNOSIS — E119 Type 2 diabetes mellitus without complications: Secondary | ICD-10-CM | POA: Diagnosis not present

## 2019-09-06 DIAGNOSIS — E039 Hypothyroidism, unspecified: Secondary | ICD-10-CM | POA: Diagnosis not present

## 2019-09-06 DIAGNOSIS — D649 Anemia, unspecified: Secondary | ICD-10-CM | POA: Diagnosis not present

## 2019-09-06 DIAGNOSIS — E785 Hyperlipidemia, unspecified: Secondary | ICD-10-CM | POA: Diagnosis not present

## 2019-09-06 DIAGNOSIS — I10 Essential (primary) hypertension: Secondary | ICD-10-CM | POA: Diagnosis not present

## 2019-09-08 DIAGNOSIS — I10 Essential (primary) hypertension: Secondary | ICD-10-CM | POA: Diagnosis not present

## 2019-09-08 DIAGNOSIS — F0390 Unspecified dementia without behavioral disturbance: Secondary | ICD-10-CM | POA: Diagnosis not present

## 2019-09-08 DIAGNOSIS — K219 Gastro-esophageal reflux disease without esophagitis: Secondary | ICD-10-CM | POA: Diagnosis not present

## 2019-09-08 DIAGNOSIS — R7989 Other specified abnormal findings of blood chemistry: Secondary | ICD-10-CM | POA: Diagnosis not present

## 2019-09-13 DIAGNOSIS — I10 Essential (primary) hypertension: Secondary | ICD-10-CM | POA: Diagnosis not present

## 2019-09-13 DIAGNOSIS — W19XXXA Unspecified fall, initial encounter: Secondary | ICD-10-CM | POA: Diagnosis not present

## 2019-09-14 DIAGNOSIS — E785 Hyperlipidemia, unspecified: Secondary | ICD-10-CM | POA: Diagnosis not present

## 2019-09-14 DIAGNOSIS — D649 Anemia, unspecified: Secondary | ICD-10-CM | POA: Diagnosis not present

## 2019-09-21 DIAGNOSIS — F329 Major depressive disorder, single episode, unspecified: Secondary | ICD-10-CM | POA: Diagnosis not present

## 2019-10-06 DIAGNOSIS — F0390 Unspecified dementia without behavioral disturbance: Secondary | ICD-10-CM | POA: Diagnosis not present

## 2019-10-06 DIAGNOSIS — I10 Essential (primary) hypertension: Secondary | ICD-10-CM | POA: Diagnosis not present

## 2019-10-06 DIAGNOSIS — K219 Gastro-esophageal reflux disease without esophagitis: Secondary | ICD-10-CM | POA: Diagnosis not present

## 2019-10-06 DIAGNOSIS — E785 Hyperlipidemia, unspecified: Secondary | ICD-10-CM | POA: Diagnosis not present

## 2019-10-23 DIAGNOSIS — G4700 Insomnia, unspecified: Secondary | ICD-10-CM | POA: Diagnosis not present

## 2019-10-23 DIAGNOSIS — F0390 Unspecified dementia without behavioral disturbance: Secondary | ICD-10-CM | POA: Diagnosis not present

## 2019-10-23 DIAGNOSIS — F329 Major depressive disorder, single episode, unspecified: Secondary | ICD-10-CM | POA: Diagnosis not present

## 2019-11-01 DIAGNOSIS — F329 Major depressive disorder, single episode, unspecified: Secondary | ICD-10-CM | POA: Diagnosis not present

## 2019-11-01 DIAGNOSIS — F0390 Unspecified dementia without behavioral disturbance: Secondary | ICD-10-CM | POA: Diagnosis not present

## 2019-11-02 DIAGNOSIS — E039 Hypothyroidism, unspecified: Secondary | ICD-10-CM | POA: Diagnosis not present

## 2019-11-02 DIAGNOSIS — D649 Anemia, unspecified: Secondary | ICD-10-CM | POA: Diagnosis not present

## 2019-11-02 DIAGNOSIS — E785 Hyperlipidemia, unspecified: Secondary | ICD-10-CM | POA: Diagnosis not present

## 2019-11-02 DIAGNOSIS — I10 Essential (primary) hypertension: Secondary | ICD-10-CM | POA: Diagnosis not present

## 2019-11-16 DIAGNOSIS — S81812A Laceration without foreign body, left lower leg, initial encounter: Secondary | ICD-10-CM | POA: Diagnosis not present

## 2019-11-29 DIAGNOSIS — N390 Urinary tract infection, site not specified: Secondary | ICD-10-CM | POA: Diagnosis not present

## 2019-11-29 DIAGNOSIS — R319 Hematuria, unspecified: Secondary | ICD-10-CM | POA: Diagnosis not present

## 2019-11-30 DIAGNOSIS — R2681 Unsteadiness on feet: Secondary | ICD-10-CM | POA: Diagnosis not present

## 2019-11-30 DIAGNOSIS — R296 Repeated falls: Secondary | ICD-10-CM | POA: Diagnosis not present

## 2019-12-03 DIAGNOSIS — Z79899 Other long term (current) drug therapy: Secondary | ICD-10-CM | POA: Diagnosis not present

## 2019-12-03 DIAGNOSIS — R319 Hematuria, unspecified: Secondary | ICD-10-CM | POA: Diagnosis not present

## 2019-12-03 DIAGNOSIS — N390 Urinary tract infection, site not specified: Secondary | ICD-10-CM | POA: Diagnosis not present

## 2019-12-07 DIAGNOSIS — R2681 Unsteadiness on feet: Secondary | ICD-10-CM | POA: Diagnosis not present

## 2019-12-07 DIAGNOSIS — B9620 Unspecified Escherichia coli [E. coli] as the cause of diseases classified elsewhere: Secondary | ICD-10-CM | POA: Diagnosis not present

## 2019-12-07 DIAGNOSIS — N390 Urinary tract infection, site not specified: Secondary | ICD-10-CM | POA: Diagnosis not present

## 2019-12-07 DIAGNOSIS — R296 Repeated falls: Secondary | ICD-10-CM | POA: Diagnosis not present

## 2019-12-09 DIAGNOSIS — G309 Alzheimer's disease, unspecified: Secondary | ICD-10-CM | POA: Diagnosis not present

## 2019-12-09 DIAGNOSIS — R0989 Other specified symptoms and signs involving the circulatory and respiratory systems: Secondary | ICD-10-CM | POA: Diagnosis not present

## 2019-12-09 DIAGNOSIS — R531 Weakness: Secondary | ICD-10-CM | POA: Diagnosis not present

## 2019-12-09 DIAGNOSIS — I10 Essential (primary) hypertension: Secondary | ICD-10-CM | POA: Diagnosis not present

## 2019-12-09 DIAGNOSIS — E785 Hyperlipidemia, unspecified: Secondary | ICD-10-CM | POA: Diagnosis not present

## 2019-12-09 DIAGNOSIS — S0990XA Unspecified injury of head, initial encounter: Secondary | ICD-10-CM | POA: Diagnosis not present

## 2019-12-09 DIAGNOSIS — W19XXXA Unspecified fall, initial encounter: Secondary | ICD-10-CM | POA: Diagnosis not present

## 2019-12-09 DIAGNOSIS — Z79899 Other long term (current) drug therapy: Secondary | ICD-10-CM | POA: Diagnosis not present

## 2019-12-09 DIAGNOSIS — Z8616 Personal history of COVID-19: Secondary | ICD-10-CM | POA: Diagnosis not present

## 2019-12-09 DIAGNOSIS — R58 Hemorrhage, not elsewhere classified: Secondary | ICD-10-CM | POA: Diagnosis not present

## 2019-12-09 DIAGNOSIS — F329 Major depressive disorder, single episode, unspecified: Secondary | ICD-10-CM | POA: Diagnosis not present

## 2019-12-09 DIAGNOSIS — S199XXA Unspecified injury of neck, initial encounter: Secondary | ICD-10-CM | POA: Diagnosis not present

## 2019-12-09 DIAGNOSIS — S0083XA Contusion of other part of head, initial encounter: Secondary | ICD-10-CM | POA: Diagnosis not present

## 2019-12-09 DIAGNOSIS — M7989 Other specified soft tissue disorders: Secondary | ICD-10-CM | POA: Diagnosis not present

## 2019-12-09 DIAGNOSIS — F0280 Dementia in other diseases classified elsewhere without behavioral disturbance: Secondary | ICD-10-CM | POA: Diagnosis not present

## 2019-12-09 DIAGNOSIS — S0181XA Laceration without foreign body of other part of head, initial encounter: Secondary | ICD-10-CM | POA: Diagnosis not present

## 2019-12-09 DIAGNOSIS — Z043 Encounter for examination and observation following other accident: Secondary | ICD-10-CM | POA: Diagnosis not present

## 2019-12-09 DIAGNOSIS — S022XXA Fracture of nasal bones, initial encounter for closed fracture: Secondary | ICD-10-CM | POA: Diagnosis not present

## 2019-12-10 DIAGNOSIS — Z743 Need for continuous supervision: Secondary | ICD-10-CM | POA: Diagnosis not present

## 2019-12-10 DIAGNOSIS — W19XXXA Unspecified fall, initial encounter: Secondary | ICD-10-CM | POA: Diagnosis not present

## 2019-12-10 DIAGNOSIS — I10 Essential (primary) hypertension: Secondary | ICD-10-CM | POA: Diagnosis not present

## 2019-12-10 DIAGNOSIS — R279 Unspecified lack of coordination: Secondary | ICD-10-CM | POA: Diagnosis not present

## 2019-12-13 DIAGNOSIS — R2681 Unsteadiness on feet: Secondary | ICD-10-CM | POA: Diagnosis not present

## 2019-12-13 DIAGNOSIS — F0390 Unspecified dementia without behavioral disturbance: Secondary | ICD-10-CM | POA: Diagnosis not present

## 2019-12-13 DIAGNOSIS — R296 Repeated falls: Secondary | ICD-10-CM | POA: Diagnosis not present

## 2019-12-14 DIAGNOSIS — M6281 Muscle weakness (generalized): Secondary | ICD-10-CM | POA: Diagnosis not present

## 2019-12-14 DIAGNOSIS — R262 Difficulty in walking, not elsewhere classified: Secondary | ICD-10-CM | POA: Diagnosis not present

## 2019-12-15 DIAGNOSIS — G4700 Insomnia, unspecified: Secondary | ICD-10-CM | POA: Diagnosis not present

## 2019-12-15 DIAGNOSIS — F0390 Unspecified dementia without behavioral disturbance: Secondary | ICD-10-CM | POA: Diagnosis not present

## 2019-12-15 DIAGNOSIS — F329 Major depressive disorder, single episode, unspecified: Secondary | ICD-10-CM | POA: Diagnosis not present

## 2020-01-03 DIAGNOSIS — R2681 Unsteadiness on feet: Secondary | ICD-10-CM | POA: Diagnosis not present

## 2020-01-03 DIAGNOSIS — R296 Repeated falls: Secondary | ICD-10-CM | POA: Diagnosis not present

## 2020-01-11 DIAGNOSIS — R262 Difficulty in walking, not elsewhere classified: Secondary | ICD-10-CM | POA: Diagnosis not present

## 2020-01-11 DIAGNOSIS — M6281 Muscle weakness (generalized): Secondary | ICD-10-CM | POA: Diagnosis not present

## 2020-01-21 DIAGNOSIS — R600 Localized edema: Secondary | ICD-10-CM | POA: Diagnosis not present

## 2020-01-26 DIAGNOSIS — G4700 Insomnia, unspecified: Secondary | ICD-10-CM | POA: Diagnosis not present

## 2020-01-26 DIAGNOSIS — F0390 Unspecified dementia without behavioral disturbance: Secondary | ICD-10-CM | POA: Diagnosis not present

## 2020-01-26 DIAGNOSIS — F329 Major depressive disorder, single episode, unspecified: Secondary | ICD-10-CM | POA: Diagnosis not present

## 2020-02-08 DIAGNOSIS — D649 Anemia, unspecified: Secondary | ICD-10-CM | POA: Diagnosis not present

## 2020-02-08 DIAGNOSIS — R0602 Shortness of breath: Secondary | ICD-10-CM | POA: Diagnosis not present

## 2020-02-08 DIAGNOSIS — I10 Essential (primary) hypertension: Secondary | ICD-10-CM | POA: Diagnosis not present

## 2020-02-10 DIAGNOSIS — R2681 Unsteadiness on feet: Secondary | ICD-10-CM | POA: Diagnosis not present

## 2020-02-10 DIAGNOSIS — G301 Alzheimer's disease with late onset: Secondary | ICD-10-CM | POA: Diagnosis not present

## 2020-02-10 DIAGNOSIS — R262 Difficulty in walking, not elsewhere classified: Secondary | ICD-10-CM | POA: Diagnosis not present

## 2020-02-10 DIAGNOSIS — R600 Localized edema: Secondary | ICD-10-CM | POA: Diagnosis not present

## 2020-02-10 DIAGNOSIS — M6281 Muscle weakness (generalized): Secondary | ICD-10-CM | POA: Diagnosis not present

## 2020-02-21 DIAGNOSIS — F0390 Unspecified dementia without behavioral disturbance: Secondary | ICD-10-CM | POA: Diagnosis not present

## 2020-02-21 DIAGNOSIS — F0391 Unspecified dementia with behavioral disturbance: Secondary | ICD-10-CM | POA: Diagnosis not present

## 2020-02-23 DIAGNOSIS — F329 Major depressive disorder, single episode, unspecified: Secondary | ICD-10-CM | POA: Diagnosis not present

## 2020-02-23 DIAGNOSIS — F0390 Unspecified dementia without behavioral disturbance: Secondary | ICD-10-CM | POA: Diagnosis not present

## 2020-02-23 DIAGNOSIS — I10 Essential (primary) hypertension: Secondary | ICD-10-CM | POA: Diagnosis not present

## 2020-02-25 DIAGNOSIS — F39 Unspecified mood [affective] disorder: Secondary | ICD-10-CM | POA: Diagnosis not present

## 2020-02-25 DIAGNOSIS — G301 Alzheimer's disease with late onset: Secondary | ICD-10-CM | POA: Diagnosis not present

## 2020-02-25 DIAGNOSIS — F0390 Unspecified dementia without behavioral disturbance: Secondary | ICD-10-CM | POA: Diagnosis not present

## 2020-02-25 DIAGNOSIS — Z79899 Other long term (current) drug therapy: Secondary | ICD-10-CM | POA: Diagnosis not present

## 2020-03-02 DIAGNOSIS — I10 Essential (primary) hypertension: Secondary | ICD-10-CM | POA: Diagnosis not present

## 2020-03-02 DIAGNOSIS — R569 Unspecified convulsions: Secondary | ICD-10-CM | POA: Diagnosis not present

## 2020-03-02 DIAGNOSIS — G309 Alzheimer's disease, unspecified: Secondary | ICD-10-CM | POA: Diagnosis not present

## 2020-03-06 DIAGNOSIS — F0390 Unspecified dementia without behavioral disturbance: Secondary | ICD-10-CM | POA: Diagnosis not present

## 2020-03-06 DIAGNOSIS — F39 Unspecified mood [affective] disorder: Secondary | ICD-10-CM | POA: Diagnosis not present

## 2020-03-06 DIAGNOSIS — Z79899 Other long term (current) drug therapy: Secondary | ICD-10-CM | POA: Diagnosis not present

## 2020-03-06 DIAGNOSIS — G301 Alzheimer's disease with late onset: Secondary | ICD-10-CM | POA: Diagnosis not present

## 2020-03-13 DIAGNOSIS — M6281 Muscle weakness (generalized): Secondary | ICD-10-CM | POA: Diagnosis not present

## 2020-03-13 DIAGNOSIS — R262 Difficulty in walking, not elsewhere classified: Secondary | ICD-10-CM | POA: Diagnosis not present

## 2020-03-29 DIAGNOSIS — G4700 Insomnia, unspecified: Secondary | ICD-10-CM | POA: Diagnosis not present

## 2020-03-29 DIAGNOSIS — F39 Unspecified mood [affective] disorder: Secondary | ICD-10-CM | POA: Diagnosis not present

## 2020-03-29 DIAGNOSIS — F0390 Unspecified dementia without behavioral disturbance: Secondary | ICD-10-CM | POA: Diagnosis not present

## 2020-03-29 DIAGNOSIS — F330 Major depressive disorder, recurrent, mild: Secondary | ICD-10-CM | POA: Diagnosis not present

## 2020-04-09 DIAGNOSIS — Z1159 Encounter for screening for other viral diseases: Secondary | ICD-10-CM | POA: Diagnosis not present

## 2020-04-13 DIAGNOSIS — Z03818 Encounter for observation for suspected exposure to other biological agents ruled out: Secondary | ICD-10-CM | POA: Diagnosis not present

## 2020-04-20 DIAGNOSIS — R627 Adult failure to thrive: Secondary | ICD-10-CM | POA: Diagnosis not present

## 2020-04-20 DIAGNOSIS — F0280 Dementia in other diseases classified elsewhere without behavioral disturbance: Secondary | ICD-10-CM | POA: Diagnosis not present

## 2020-04-20 DIAGNOSIS — E785 Hyperlipidemia, unspecified: Secondary | ICD-10-CM | POA: Diagnosis not present

## 2020-04-20 DIAGNOSIS — M8949 Other hypertrophic osteoarthropathy, multiple sites: Secondary | ICD-10-CM | POA: Diagnosis not present

## 2020-04-24 DIAGNOSIS — Z1159 Encounter for screening for other viral diseases: Secondary | ICD-10-CM | POA: Diagnosis not present

## 2020-04-27 DIAGNOSIS — Z1159 Encounter for screening for other viral diseases: Secondary | ICD-10-CM | POA: Diagnosis not present

## 2020-05-01 DIAGNOSIS — Z03818 Encounter for observation for suspected exposure to other biological agents ruled out: Secondary | ICD-10-CM | POA: Diagnosis not present

## 2020-05-04 DIAGNOSIS — Z1159 Encounter for screening for other viral diseases: Secondary | ICD-10-CM | POA: Diagnosis not present

## 2020-05-08 DIAGNOSIS — M6281 Muscle weakness (generalized): Secondary | ICD-10-CM | POA: Diagnosis not present

## 2020-05-10 DIAGNOSIS — F0390 Unspecified dementia without behavioral disturbance: Secondary | ICD-10-CM | POA: Diagnosis not present

## 2020-05-10 DIAGNOSIS — F39 Unspecified mood [affective] disorder: Secondary | ICD-10-CM | POA: Diagnosis not present

## 2020-05-10 DIAGNOSIS — F330 Major depressive disorder, recurrent, mild: Secondary | ICD-10-CM | POA: Diagnosis not present

## 2020-05-10 DIAGNOSIS — G4700 Insomnia, unspecified: Secondary | ICD-10-CM | POA: Diagnosis not present

## 2020-05-12 DIAGNOSIS — M6281 Muscle weakness (generalized): Secondary | ICD-10-CM | POA: Diagnosis not present

## 2020-05-22 DIAGNOSIS — Z03818 Encounter for observation for suspected exposure to other biological agents ruled out: Secondary | ICD-10-CM | POA: Diagnosis not present

## 2020-05-25 DIAGNOSIS — Z03818 Encounter for observation for suspected exposure to other biological agents ruled out: Secondary | ICD-10-CM | POA: Diagnosis not present

## 2020-05-29 DIAGNOSIS — R296 Repeated falls: Secondary | ICD-10-CM | POA: Diagnosis not present

## 2020-05-29 DIAGNOSIS — F0280 Dementia in other diseases classified elsewhere without behavioral disturbance: Secondary | ICD-10-CM | POA: Diagnosis not present

## 2020-05-29 DIAGNOSIS — Z03818 Encounter for observation for suspected exposure to other biological agents ruled out: Secondary | ICD-10-CM | POA: Diagnosis not present

## 2020-05-29 DIAGNOSIS — G301 Alzheimer's disease with late onset: Secondary | ICD-10-CM | POA: Diagnosis not present

## 2020-06-01 DIAGNOSIS — Z03818 Encounter for observation for suspected exposure to other biological agents ruled out: Secondary | ICD-10-CM | POA: Diagnosis not present

## 2020-06-07 DIAGNOSIS — F0390 Unspecified dementia without behavioral disturbance: Secondary | ICD-10-CM | POA: Diagnosis not present

## 2020-06-07 DIAGNOSIS — F330 Major depressive disorder, recurrent, mild: Secondary | ICD-10-CM | POA: Diagnosis not present

## 2020-06-07 DIAGNOSIS — F39 Unspecified mood [affective] disorder: Secondary | ICD-10-CM | POA: Diagnosis not present

## 2020-06-07 DIAGNOSIS — G4700 Insomnia, unspecified: Secondary | ICD-10-CM | POA: Diagnosis not present

## 2020-06-09 DIAGNOSIS — R52 Pain, unspecified: Secondary | ICD-10-CM | POA: Diagnosis not present

## 2020-06-09 DIAGNOSIS — Z79899 Other long term (current) drug therapy: Secondary | ICD-10-CM | POA: Diagnosis not present

## 2020-06-14 DIAGNOSIS — F329 Major depressive disorder, single episode, unspecified: Secondary | ICD-10-CM | POA: Diagnosis not present

## 2020-06-14 DIAGNOSIS — E559 Vitamin D deficiency, unspecified: Secondary | ICD-10-CM | POA: Diagnosis not present

## 2020-06-14 DIAGNOSIS — R1013 Epigastric pain: Secondary | ICD-10-CM | POA: Diagnosis not present

## 2020-07-05 DIAGNOSIS — F0390 Unspecified dementia without behavioral disturbance: Secondary | ICD-10-CM | POA: Diagnosis not present

## 2020-07-05 DIAGNOSIS — F39 Unspecified mood [affective] disorder: Secondary | ICD-10-CM | POA: Diagnosis not present

## 2020-07-05 DIAGNOSIS — F330 Major depressive disorder, recurrent, mild: Secondary | ICD-10-CM | POA: Diagnosis not present

## 2020-07-05 DIAGNOSIS — G4700 Insomnia, unspecified: Secondary | ICD-10-CM | POA: Diagnosis not present

## 2020-07-20 DIAGNOSIS — L89153 Pressure ulcer of sacral region, stage 3: Secondary | ICD-10-CM | POA: Diagnosis not present

## 2020-07-28 DIAGNOSIS — F0280 Dementia in other diseases classified elsewhere without behavioral disturbance: Secondary | ICD-10-CM | POA: Diagnosis not present

## 2020-07-28 DIAGNOSIS — R634 Abnormal weight loss: Secondary | ICD-10-CM | POA: Diagnosis not present

## 2020-07-28 DIAGNOSIS — G301 Alzheimer's disease with late onset: Secondary | ICD-10-CM | POA: Diagnosis not present

## 2020-08-16 DIAGNOSIS — F330 Major depressive disorder, recurrent, mild: Secondary | ICD-10-CM | POA: Diagnosis not present

## 2020-08-16 DIAGNOSIS — F39 Unspecified mood [affective] disorder: Secondary | ICD-10-CM | POA: Diagnosis not present

## 2020-08-16 DIAGNOSIS — F0390 Unspecified dementia without behavioral disturbance: Secondary | ICD-10-CM | POA: Diagnosis not present

## 2020-08-16 DIAGNOSIS — G4700 Insomnia, unspecified: Secondary | ICD-10-CM | POA: Diagnosis not present

## 2020-08-21 DIAGNOSIS — I10 Essential (primary) hypertension: Secondary | ICD-10-CM | POA: Diagnosis not present

## 2020-08-21 DIAGNOSIS — E559 Vitamin D deficiency, unspecified: Secondary | ICD-10-CM | POA: Diagnosis not present

## 2020-08-21 DIAGNOSIS — I509 Heart failure, unspecified: Secondary | ICD-10-CM | POA: Diagnosis not present

## 2020-08-21 DIAGNOSIS — Z79899 Other long term (current) drug therapy: Secondary | ICD-10-CM | POA: Diagnosis not present

## 2020-08-22 DIAGNOSIS — I10 Essential (primary) hypertension: Secondary | ICD-10-CM | POA: Diagnosis not present

## 2020-08-22 DIAGNOSIS — F0280 Dementia in other diseases classified elsewhere without behavioral disturbance: Secondary | ICD-10-CM | POA: Diagnosis not present

## 2020-08-22 DIAGNOSIS — K219 Gastro-esophageal reflux disease without esophagitis: Secondary | ICD-10-CM | POA: Diagnosis not present

## 2020-08-22 DIAGNOSIS — F3340 Major depressive disorder, recurrent, in remission, unspecified: Secondary | ICD-10-CM | POA: Diagnosis not present

## 2020-08-22 DIAGNOSIS — E559 Vitamin D deficiency, unspecified: Secondary | ICD-10-CM | POA: Diagnosis not present

## 2020-08-22 DIAGNOSIS — G301 Alzheimer's disease with late onset: Secondary | ICD-10-CM | POA: Diagnosis not present

## 2020-08-22 DIAGNOSIS — M150 Primary generalized (osteo)arthritis: Secondary | ICD-10-CM | POA: Diagnosis not present

## 2020-08-22 DIAGNOSIS — E785 Hyperlipidemia, unspecified: Secondary | ICD-10-CM | POA: Diagnosis not present

## 2020-08-29 DIAGNOSIS — M6281 Muscle weakness (generalized): Secondary | ICD-10-CM | POA: Diagnosis not present

## 2020-08-29 DIAGNOSIS — R6339 Other feeding difficulties: Secondary | ICD-10-CM | POA: Diagnosis not present

## 2020-09-11 DIAGNOSIS — S5011XA Contusion of right forearm, initial encounter: Secondary | ICD-10-CM | POA: Diagnosis not present

## 2020-09-13 DIAGNOSIS — F330 Major depressive disorder, recurrent, mild: Secondary | ICD-10-CM | POA: Diagnosis not present

## 2020-09-13 DIAGNOSIS — F0390 Unspecified dementia without behavioral disturbance: Secondary | ICD-10-CM | POA: Diagnosis not present

## 2020-09-13 DIAGNOSIS — G4700 Insomnia, unspecified: Secondary | ICD-10-CM | POA: Diagnosis not present

## 2020-09-13 DIAGNOSIS — F39 Unspecified mood [affective] disorder: Secondary | ICD-10-CM | POA: Diagnosis not present

## 2020-10-04 DIAGNOSIS — G301 Alzheimer's disease with late onset: Secondary | ICD-10-CM | POA: Diagnosis not present

## 2020-10-04 DIAGNOSIS — M150 Primary generalized (osteo)arthritis: Secondary | ICD-10-CM | POA: Diagnosis not present

## 2020-10-04 DIAGNOSIS — I10 Essential (primary) hypertension: Secondary | ICD-10-CM | POA: Diagnosis not present

## 2020-10-16 DIAGNOSIS — R296 Repeated falls: Secondary | ICD-10-CM | POA: Diagnosis not present

## 2020-10-16 DIAGNOSIS — R531 Weakness: Secondary | ICD-10-CM | POA: Diagnosis not present

## 2020-10-20 DIAGNOSIS — G301 Alzheimer's disease with late onset: Secondary | ICD-10-CM | POA: Diagnosis not present

## 2020-10-20 DIAGNOSIS — F0280 Dementia in other diseases classified elsewhere without behavioral disturbance: Secondary | ICD-10-CM | POA: Diagnosis not present

## 2020-10-20 DIAGNOSIS — R296 Repeated falls: Secondary | ICD-10-CM | POA: Diagnosis not present

## 2020-10-22 DIAGNOSIS — R5381 Other malaise: Secondary | ICD-10-CM | POA: Diagnosis not present

## 2020-10-22 DIAGNOSIS — E785 Hyperlipidemia, unspecified: Secondary | ICD-10-CM | POA: Diagnosis not present

## 2020-10-22 DIAGNOSIS — M549 Dorsalgia, unspecified: Secondary | ICD-10-CM | POA: Diagnosis not present

## 2020-10-22 DIAGNOSIS — S32030A Wedge compression fracture of third lumbar vertebra, initial encounter for closed fracture: Secondary | ICD-10-CM | POA: Diagnosis not present

## 2020-10-22 DIAGNOSIS — M546 Pain in thoracic spine: Secondary | ICD-10-CM | POA: Diagnosis not present

## 2020-10-22 DIAGNOSIS — R102 Pelvic and perineal pain: Secondary | ICD-10-CM | POA: Diagnosis not present

## 2020-10-22 DIAGNOSIS — S0091XA Abrasion of unspecified part of head, initial encounter: Secondary | ICD-10-CM | POA: Diagnosis not present

## 2020-10-22 DIAGNOSIS — S79921A Unspecified injury of right thigh, initial encounter: Secondary | ICD-10-CM | POA: Diagnosis not present

## 2020-10-22 DIAGNOSIS — R609 Edema, unspecified: Secondary | ICD-10-CM | POA: Diagnosis not present

## 2020-10-22 DIAGNOSIS — S0083XA Contusion of other part of head, initial encounter: Secondary | ICD-10-CM | POA: Diagnosis not present

## 2020-10-22 DIAGNOSIS — G4700 Insomnia, unspecified: Secondary | ICD-10-CM | POA: Diagnosis not present

## 2020-10-22 DIAGNOSIS — S79922A Unspecified injury of left thigh, initial encounter: Secondary | ICD-10-CM | POA: Diagnosis not present

## 2020-10-22 DIAGNOSIS — W19XXXA Unspecified fall, initial encounter: Secondary | ICD-10-CM | POA: Diagnosis not present

## 2020-10-22 DIAGNOSIS — N3000 Acute cystitis without hematuria: Secondary | ICD-10-CM | POA: Diagnosis not present

## 2020-10-22 DIAGNOSIS — M2550 Pain in unspecified joint: Secondary | ICD-10-CM | POA: Diagnosis not present

## 2020-10-22 DIAGNOSIS — S40022A Contusion of left upper arm, initial encounter: Secondary | ICD-10-CM | POA: Diagnosis not present

## 2020-10-22 DIAGNOSIS — Z9181 History of falling: Secondary | ICD-10-CM | POA: Diagnosis not present

## 2020-10-22 DIAGNOSIS — R220 Localized swelling, mass and lump, head: Secondary | ICD-10-CM | POA: Diagnosis not present

## 2020-10-22 DIAGNOSIS — S32039A Unspecified fracture of third lumbar vertebra, initial encounter for closed fracture: Secondary | ICD-10-CM | POA: Diagnosis not present

## 2020-10-22 DIAGNOSIS — S8992XA Unspecified injury of left lower leg, initial encounter: Secondary | ICD-10-CM | POA: Diagnosis not present

## 2020-10-22 DIAGNOSIS — G8911 Acute pain due to trauma: Secondary | ICD-10-CM | POA: Diagnosis not present

## 2020-10-22 DIAGNOSIS — S22080A Wedge compression fracture of T11-T12 vertebra, initial encounter for closed fracture: Secondary | ICD-10-CM | POA: Diagnosis not present

## 2020-10-22 DIAGNOSIS — S8991XA Unspecified injury of right lower leg, initial encounter: Secondary | ICD-10-CM | POA: Diagnosis not present

## 2020-10-22 DIAGNOSIS — F32A Depression, unspecified: Secondary | ICD-10-CM | POA: Diagnosis not present

## 2020-10-22 DIAGNOSIS — S8011XA Contusion of right lower leg, initial encounter: Secondary | ICD-10-CM | POA: Diagnosis not present

## 2020-10-22 DIAGNOSIS — Z7401 Bed confinement status: Secondary | ICD-10-CM | POA: Diagnosis not present

## 2020-10-22 DIAGNOSIS — I10 Essential (primary) hypertension: Secondary | ICD-10-CM | POA: Diagnosis not present

## 2020-10-22 DIAGNOSIS — M4854XA Collapsed vertebra, not elsewhere classified, thoracic region, initial encounter for fracture: Secondary | ICD-10-CM | POA: Diagnosis not present

## 2020-10-22 DIAGNOSIS — S40021A Contusion of right upper arm, initial encounter: Secondary | ICD-10-CM | POA: Diagnosis not present

## 2020-10-22 DIAGNOSIS — Z8616 Personal history of COVID-19: Secondary | ICD-10-CM | POA: Diagnosis not present

## 2020-10-22 DIAGNOSIS — S8012XA Contusion of left lower leg, initial encounter: Secondary | ICD-10-CM | POA: Diagnosis not present

## 2020-10-22 DIAGNOSIS — F0280 Dementia in other diseases classified elsewhere without behavioral disturbance: Secondary | ICD-10-CM | POA: Diagnosis not present

## 2020-10-22 DIAGNOSIS — M4856XA Collapsed vertebra, not elsewhere classified, lumbar region, initial encounter for fracture: Secondary | ICD-10-CM | POA: Diagnosis not present

## 2020-10-22 DIAGNOSIS — Z87440 Personal history of urinary (tract) infections: Secondary | ICD-10-CM | POA: Diagnosis not present

## 2020-10-22 DIAGNOSIS — G309 Alzheimer's disease, unspecified: Secondary | ICD-10-CM | POA: Diagnosis not present

## 2020-10-22 DIAGNOSIS — S0990XA Unspecified injury of head, initial encounter: Secondary | ICD-10-CM | POA: Diagnosis not present

## 2020-10-22 DIAGNOSIS — Z043 Encounter for examination and observation following other accident: Secondary | ICD-10-CM | POA: Diagnosis not present

## 2020-10-22 DIAGNOSIS — R0902 Hypoxemia: Secondary | ICD-10-CM | POA: Diagnosis not present

## 2020-10-22 DIAGNOSIS — R296 Repeated falls: Secondary | ICD-10-CM | POA: Diagnosis not present

## 2020-10-22 DIAGNOSIS — S22089A Unspecified fracture of T11-T12 vertebra, initial encounter for closed fracture: Secondary | ICD-10-CM | POA: Diagnosis not present

## 2020-10-22 DIAGNOSIS — M545 Low back pain, unspecified: Secondary | ICD-10-CM | POA: Diagnosis not present

## 2020-10-23 DIAGNOSIS — R296 Repeated falls: Secondary | ICD-10-CM | POA: Diagnosis not present

## 2020-10-23 DIAGNOSIS — S22089A Unspecified fracture of T11-T12 vertebra, initial encounter for closed fracture: Secondary | ICD-10-CM | POA: Diagnosis not present

## 2020-10-23 DIAGNOSIS — S32039A Unspecified fracture of third lumbar vertebra, initial encounter for closed fracture: Secondary | ICD-10-CM | POA: Diagnosis not present

## 2020-11-03 DIAGNOSIS — M2042 Other hammer toe(s) (acquired), left foot: Secondary | ICD-10-CM | POA: Diagnosis not present

## 2020-11-03 DIAGNOSIS — Z79899 Other long term (current) drug therapy: Secondary | ICD-10-CM | POA: Diagnosis not present

## 2020-11-03 DIAGNOSIS — B351 Tinea unguium: Secondary | ICD-10-CM | POA: Diagnosis not present

## 2020-11-03 DIAGNOSIS — I739 Peripheral vascular disease, unspecified: Secondary | ICD-10-CM | POA: Diagnosis not present

## 2020-11-03 DIAGNOSIS — M2041 Other hammer toe(s) (acquired), right foot: Secondary | ICD-10-CM | POA: Diagnosis not present

## 2020-11-28 DIAGNOSIS — S81812A Laceration without foreign body, left lower leg, initial encounter: Secondary | ICD-10-CM | POA: Diagnosis not present

## 2020-12-01 DIAGNOSIS — K219 Gastro-esophageal reflux disease without esophagitis: Secondary | ICD-10-CM | POA: Diagnosis not present

## 2020-12-01 DIAGNOSIS — E785 Hyperlipidemia, unspecified: Secondary | ICD-10-CM | POA: Diagnosis not present

## 2020-12-01 DIAGNOSIS — G301 Alzheimer's disease with late onset: Secondary | ICD-10-CM | POA: Diagnosis not present

## 2020-12-01 DIAGNOSIS — R627 Adult failure to thrive: Secondary | ICD-10-CM | POA: Diagnosis not present

## 2020-12-01 DIAGNOSIS — F3340 Major depressive disorder, recurrent, in remission, unspecified: Secondary | ICD-10-CM | POA: Diagnosis not present

## 2020-12-01 DIAGNOSIS — F0280 Dementia in other diseases classified elsewhere without behavioral disturbance: Secondary | ICD-10-CM | POA: Diagnosis not present

## 2020-12-01 DIAGNOSIS — M150 Primary generalized (osteo)arthritis: Secondary | ICD-10-CM | POA: Diagnosis not present

## 2020-12-01 DIAGNOSIS — I10 Essential (primary) hypertension: Secondary | ICD-10-CM | POA: Diagnosis not present

## 2020-12-06 DIAGNOSIS — F330 Major depressive disorder, recurrent, mild: Secondary | ICD-10-CM | POA: Diagnosis not present

## 2020-12-06 DIAGNOSIS — L89150 Pressure ulcer of sacral region, unstageable: Secondary | ICD-10-CM | POA: Diagnosis not present

## 2020-12-06 DIAGNOSIS — G4700 Insomnia, unspecified: Secondary | ICD-10-CM | POA: Diagnosis not present

## 2020-12-06 DIAGNOSIS — F39 Unspecified mood [affective] disorder: Secondary | ICD-10-CM | POA: Diagnosis not present

## 2020-12-06 DIAGNOSIS — F0390 Unspecified dementia without behavioral disturbance: Secondary | ICD-10-CM | POA: Diagnosis not present

## 2020-12-11 DIAGNOSIS — L97929 Non-pressure chronic ulcer of unspecified part of left lower leg with unspecified severity: Secondary | ICD-10-CM | POA: Diagnosis not present

## 2020-12-11 DIAGNOSIS — L89150 Pressure ulcer of sacral region, unstageable: Secondary | ICD-10-CM | POA: Diagnosis not present

## 2020-12-18 DIAGNOSIS — L89150 Pressure ulcer of sacral region, unstageable: Secondary | ICD-10-CM | POA: Diagnosis not present

## 2020-12-18 DIAGNOSIS — L89621 Pressure ulcer of left heel, stage 1: Secondary | ICD-10-CM | POA: Diagnosis not present

## 2020-12-20 DIAGNOSIS — F5101 Primary insomnia: Secondary | ICD-10-CM | POA: Diagnosis not present

## 2020-12-20 DIAGNOSIS — F0390 Unspecified dementia without behavioral disturbance: Secondary | ICD-10-CM | POA: Diagnosis not present

## 2020-12-20 DIAGNOSIS — F39 Unspecified mood [affective] disorder: Secondary | ICD-10-CM | POA: Diagnosis not present

## 2020-12-20 DIAGNOSIS — F330 Major depressive disorder, recurrent, mild: Secondary | ICD-10-CM | POA: Diagnosis not present

## 2020-12-22 DIAGNOSIS — Z515 Encounter for palliative care: Secondary | ICD-10-CM | POA: Diagnosis not present

## 2020-12-22 DIAGNOSIS — G301 Alzheimer's disease with late onset: Secondary | ICD-10-CM | POA: Diagnosis not present

## 2020-12-22 DIAGNOSIS — F0280 Dementia in other diseases classified elsewhere without behavioral disturbance: Secondary | ICD-10-CM | POA: Diagnosis not present

## 2020-12-25 DIAGNOSIS — L89154 Pressure ulcer of sacral region, stage 4: Secondary | ICD-10-CM | POA: Diagnosis not present

## 2021-01-01 DIAGNOSIS — L89154 Pressure ulcer of sacral region, stage 4: Secondary | ICD-10-CM | POA: Diagnosis not present

## 2021-01-04 DIAGNOSIS — S31 Unspecified open wound of lower back and pelvis without penetration into retroperitoneum: Secondary | ICD-10-CM | POA: Diagnosis not present

## 2021-01-04 DIAGNOSIS — Z515 Encounter for palliative care: Secondary | ICD-10-CM | POA: Diagnosis not present

## 2021-01-04 DIAGNOSIS — Z978 Presence of other specified devices: Secondary | ICD-10-CM | POA: Diagnosis not present

## 2021-01-08 DIAGNOSIS — L89154 Pressure ulcer of sacral region, stage 4: Secondary | ICD-10-CM | POA: Diagnosis not present

## 2021-01-18 DIAGNOSIS — Z978 Presence of other specified devices: Secondary | ICD-10-CM | POA: Diagnosis not present

## 2021-01-18 DIAGNOSIS — Z515 Encounter for palliative care: Secondary | ICD-10-CM | POA: Diagnosis not present

## 2021-01-18 DIAGNOSIS — S31000A Unspecified open wound of lower back and pelvis without penetration into retroperitoneum, initial encounter: Secondary | ICD-10-CM | POA: Diagnosis not present

## 2021-01-22 DIAGNOSIS — F419 Anxiety disorder, unspecified: Secondary | ICD-10-CM | POA: Diagnosis not present

## 2021-01-22 DIAGNOSIS — I10 Essential (primary) hypertension: Secondary | ICD-10-CM | POA: Diagnosis not present

## 2021-01-22 DIAGNOSIS — R52 Pain, unspecified: Secondary | ICD-10-CM | POA: Diagnosis not present

## 2021-02-09 DEATH — deceased
